# Patient Record
Sex: Male | Born: 1975
Health system: Southern US, Community
[De-identification: ages and names within clinical notes are randomized; demographics above are authoritative.]

## PROBLEM LIST (undated history)

## (undated) DIAGNOSIS — R519 Headache, unspecified: Secondary | ICD-10-CM

## (undated) DIAGNOSIS — I10 Essential (primary) hypertension: Secondary | ICD-10-CM

## (undated) DIAGNOSIS — E785 Hyperlipidemia, unspecified: Secondary | ICD-10-CM

## (undated) DIAGNOSIS — R51 Headache: Secondary | ICD-10-CM

## (undated) HISTORY — PX: TONSILLECTOMY: SUR1361

## (undated) HISTORY — PX: KNEE SURGERY: SHX244

## (undated) HISTORY — PX: WISDOM TOOTH EXTRACTION: SHX21

---

## 2010-08-19 ENCOUNTER — Encounter (INDEPENDENT_AMBULATORY_CARE_PROVIDER_SITE_OTHER): Payer: Self-pay | Admitting: *Deleted

## 2010-08-19 ENCOUNTER — Inpatient Hospital Stay (INDEPENDENT_AMBULATORY_CARE_PROVIDER_SITE_OTHER)
Admission: RE | Admit: 2010-08-19 | Discharge: 2010-08-19 | Disposition: A | Discharge status: Home / Self Care | Payer: BC Managed Care – PPO | Source: Ambulatory Visit | Attending: Emergency Medicine | Admitting: Emergency Medicine

## 2010-08-19 ENCOUNTER — Encounter: Payer: Self-pay | Admitting: Emergency Medicine

## 2010-08-19 DIAGNOSIS — R109 Unspecified abdominal pain: Secondary | ICD-10-CM

## 2010-08-19 DIAGNOSIS — M5416 Radiculopathy, lumbar region: Secondary | ICD-10-CM | POA: Insufficient documentation

## 2011-01-05 NOTE — Letter (Signed)
Summary: Out of Work  [facility]  [address]   [place], [place] [address]   Phone: [phone]  Fax: [phone]    August 19, 2010   Employee:  Andrew Bean    To Whom It May Concern:   For Medical reasons, please excuse the above named employee from work for the following dates:  08/19/2010  If you need additional information, please feel free to contact our office.         Sincerely,    Lajean Saver RN

## 2011-01-05 NOTE — Progress Notes (Signed)
Summary: Stomach Pain   Vital Signs:  Patient Profile:   35 Years Old Male CC:      abdominal Pain an diarrhea x 2am this AM Height:     74 inches Weight:      234 pounds O2 Sat:      98 % O2 treatment:    Room Air Temp:     98.5 degrees F oral Pulse rate:   83 / minute Resp:     16 per minute BP sitting:   130 / 85  (left arm) Cuff size:   large  Pt. in pain?   yes    Location:   center abdomen    Type:       sharp  Vitals Entered By: Lajean Saver RN (August 19, 2010 8:40 AM)                   Updated Prior Medication List: No Medications Current Allergies: No known allergies History of Present Illness History from: patient Chief Complaint: abdominal Pain an diarrhea x 2am this AM History of Present Illness: 1) Abd pain / cramping since 2am this morning.  He woke up and had diarrhea most of the night.  Over the past few hours, he is feeling much better.  No blood in stool.  No N/V.  He ate pizza and cereal last night.  His younger son is with him today as well and he has the same symptoms.  He is here because he has been up since early morning and unable to sleep, so his work requires a doctor's note to take the day off today.  No history of stomach problems, no GERD, no CP, SOB.  2) Asking for something to help him sleep.  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  He has tried Unisom but it makes him drowsy in the morning.  REVIEW OF SYSTEMS Constitutional Symptoms      Denies fever, chills, night sweats, weight loss, weight gain, and fatigue.  Eyes       Denies change in vision, eye pain, eye discharge, glasses, contact lenses, and eye surgery. Ear/Nose/Throat/Mouth       Denies hearing loss/aids, change in hearing, ear pain, ear discharge, dizziness, frequent runny nose, frequent nose bleeds, sinus problems, sore throat, hoarseness, and tooth pain or bleeding.  Respiratory       Denies dry cough, productive cough, wheezing,  shortness of breath, asthma, bronchitis, and emphysema/COPD.  Cardiovascular       Denies murmurs, chest pain, and tires easily with exhertion.    Gastrointestinal       Complains of stomach pain and diarrhea.      Denies nausea/vomiting, constipation, blood in bowel movements, and indigestion.      Comments: x 2am Genitourniary       Denies painful urination, kidney stones, and loss of urinary control. Neurological       Denies paralysis, seizures, and fainting/blackouts. Musculoskeletal       Denies muscle pain, joint pain, joint stiffness, decreased range of motion, redness, swelling, muscle weakness, and gout.  Skin       Denies bruising, unusual mles/lumps or sores, and hair/skin or nail changes.  Psych       Denies mood changes, temper/anger issues, anxiety/stress, speech problems, depression, and sleep problems. Other Comments: Son has same symptoms   Past History:  Past Medical History: Unremarkable  Past Surgical History: knee- Bilateral Foot- bilateral  Family  History: Thyroid CA DM HTN  Social History: Current Smoker 1/2 PPD Alcohol use-yes Drug use-no Smoking Status:  current Drug Use:  no Physical Exam General appearance: well developed, well nourished, no acute distress Chest/Lungs: no rales, wheezes, or rhonchi bilateral, breath sounds equal without effort Heart: regular rate and  rhythm, no murmur Abdomen: soft, non-tender without obvious organomegaly Skin: no obvious rashes or lesions MSE: oriented to time, place, and person Assessment New Problems: ABDOMINAL PAIN (ICD-789.00)   Plan New Medications/Changes: MELATONIN 3 MG TABS (MELATONIN) 1-2 tabs by mouth at bedtime as needed for sleep  #60 x 1, 08/19/2010, Hoyt Koch MD  New Orders: New Patient Level III [phone] Planning Comments:   1) Symptomatic care (hydration, bland diet, rest, note for work today).  If symptoms persist, we can call in Rx, either Phenergan vs Bentyl vs Lamotil,  whatever symptoms are worsening.  Clean the house to prevent others from becoming sick. 2) Rx for Melatonin   The patient and/or caregiver has been counseled thoroughly with regard to medications prescribed including dosage, schedule, interactions, rationale for use, and possible side effects and they verbalize understanding.  Diagnoses and expected course of recovery discussed and will return if not improved as expected or if the condition worsens. Patient and/or caregiver verbalized understanding.  Prescriptions: MELATONIN 3 MG TABS (MELATONIN) 1-2 tabs by mouth at bedtime as needed for sleep  #60 x 1   Entered and Authorized by:   Hoyt Koch MD   Signed by:   Hoyt Koch MD on 08/19/2010   Method used:   Print then Give to Patient   RxID:   [id number]   Orders Added: 1)  New Patient Level III [95621]

## 2012-06-14 ENCOUNTER — Encounter: Payer: Self-pay | Admitting: *Deleted

## 2012-06-14 ENCOUNTER — Emergency Department
Admission: EM | Admit: 2012-06-14 | Discharge: 2012-06-14 | Disposition: A | Discharge status: Home / Self Care | Payer: BC Managed Care – PPO | Source: Home / Self Care | Attending: Family Medicine | Admitting: Family Medicine

## 2012-06-14 DIAGNOSIS — Z716 Tobacco abuse counseling: Secondary | ICD-10-CM

## 2012-06-14 DIAGNOSIS — R42 Dizziness and giddiness: Secondary | ICD-10-CM

## 2012-06-14 DIAGNOSIS — R062 Wheezing: Secondary | ICD-10-CM

## 2012-06-14 DIAGNOSIS — J329 Chronic sinusitis, unspecified: Secondary | ICD-10-CM

## 2012-06-14 HISTORY — DX: Hyperlipidemia, unspecified: E78.5

## 2012-06-14 HISTORY — DX: Essential (primary) hypertension: I10

## 2012-06-14 MED ORDER — MECLIZINE HCL 25 MG PO TABS: 25.0000 mg | ORAL_TABLET | Freq: Three times a day (TID) | ORAL | Status: DC | PRN

## 2012-06-14 MED ORDER — AMOXICILLIN 875 MG PO TABS: 875.0000 mg | ORAL_TABLET | Freq: Two times a day (BID) | ORAL | Status: DC

## 2012-06-14 MED ORDER — METHYLPREDNISOLONE ACETATE 80 MG/ML IJ SUSP
80.0000 mg | Freq: Once | INTRAMUSCULAR | Status: AC
  Administered 2012-06-14: 80 mg via INTRAMUSCULAR

## 2012-06-14 NOTE — ED Provider Notes (Signed)
History     CSN: [id number]  Arrival date & time 06/14/12  0808   First MD Initiated Contact with Patient 06/14/12 [phone]      Chief Complaint  Patient presents with  . Dizziness  . Nasal Congestion   HPI  URI Symptoms Onset: 2-3 days  Description: sinus pressure, nasal congestion, ear pain, vertiginous sxs  Modifying factors:  2 PPD smoker. 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 No hemiparesis, confusion, slurred speech, trauma.   Symptoms Nasal discharge: yes Fever: no Sore throat: mild Cough: yes Wheezing: yes Ear pain: yes GI symptoms: no Sick contacts: no  Red Flags  Stiff neck: no Dyspnea: no Rash: no Swallowing difficulty: no  Sinusitis Risk Factors Headache/face pain: yes Double sickening: no tooth pain: no  Allergy Risk Factors Sneezing: yes Itchy scratchy throat: mild Seasonal symptoms: no  Flu Risk Factors Headache: no muscle aches: no severe fatigue: no   Past Medical History  Diagnosis Date  . Hypertension   . Hyperlipemia     Past Surgical History  Procedure Laterality Date  . Knee surgery Right     Family History  Problem Relation Age of Onset  . Heart failure Father   . Cancer Brother     thyroid    History  Substance Use Topics  . Smoking status: Current Every Day Smoker -- 2.00 packs/day  . Smokeless tobacco: Not on file  . Alcohol Use: Yes      Review of Systems  All other systems reviewed and are negative.    Allergies  Bee venom  Home Medications   Current Outpatient Rx  Name  Route  Sig  Dispense  Refill  . amoxicillin (AMOXIL) 875 MG tablet   Oral   Take 1 tablet (875 mg total) by mouth 2 (two) times daily.   20 tablet   0   . meclizine (ANTIVERT) 25 MG tablet   Oral   Take 1 tablet (25 mg total) by mouth 3 (three) times daily as needed.   30 tablet   0     BP 127/83  Pulse 95  Temp(Src) 97.5 F (36.4 C) (Oral)  Resp 18  Wt 239 lb (108.41 kg)  BMI 30.67  kg/m2  SpO2 98%  Physical Exam  Constitutional: He appears well-developed and well-nourished.  HENT:  Head: Normocephalic and atraumatic.  Right Ear: External ear normal.  Left Ear: External ear normal.  +nasal erythema, rhinorrhea bilaterally, + post oropharyngeal erythema  + maxillary TTP bilaterally    Eyes: Pupils are equal, round, and reactive to light.  Neck: Normal range of motion.  Cardiovascular: Normal rate, regular rhythm and normal heart sounds.   Pulmonary/Chest: Effort normal.  Faint wheezes in bases  Abdominal: Soft.  Musculoskeletal: Normal range of motion.  Neurological: He is alert.  dix hallpike + bilaterally      ED Course  Procedures (including critical care time)  Labs Reviewed - No data to display No results found.   1. Sinusitis   2. Vertigo   3. Tobacco abuse counseling       MDM  Depomedrol for wheezing Amox for infectious coverage.  Meclizine for vertigo.  Discussed smoking cessation at length. Discussed supportive care and infectious/neuroENT  red flags at length.  Follow up as needed.     The patient and/or caregiver has been counseled thoroughly with regard to treatment plan and/or medications prescribed including dosage, schedule, interactions, rationale for use, and possible side  effects and they verbalize understanding. Diagnoses and expected course of recovery discussed and will return if not improved as expected or if the condition worsens. Patient and/or caregiver verbalized understanding.             Doree Albee, MD 06/14/12 [phone]

## 2012-06-14 NOTE — ED Notes (Signed)
Pt c/o nasal congestion, sinus pressure and bilateral ear fullness x 2 days, with dizziness x 1 day. He has taken Claritin x 2 days with no relief. He also c/o insomnia x 1 mth. He has taken melatonin with no relief.

## 2012-09-16 ENCOUNTER — Emergency Department
Admission: EM | Admit: 2012-09-16 | Discharge: 2012-09-16 | Disposition: A | Discharge status: Home / Self Care | Payer: BC Managed Care – PPO | Source: Home / Self Care | Attending: Emergency Medicine | Admitting: Emergency Medicine

## 2012-09-16 ENCOUNTER — Encounter: Payer: Self-pay | Admitting: *Deleted

## 2012-09-16 DIAGNOSIS — T6391XA Toxic effect of contact with unspecified venomous animal, accidental (unintentional), initial encounter: Secondary | ICD-10-CM

## 2012-09-16 MED ORDER — METHYLPREDNISOLONE ACETATE 80 MG/ML IJ SUSP
80.0000 mg | Freq: Once | INTRAMUSCULAR | Status: AC
  Administered 2012-09-16: 80 mg via INTRAMUSCULAR

## 2012-09-16 NOTE — ED Provider Notes (Addendum)
CSN: [id number]     Arrival date & time 09/16/12  0932 History     First MD Initiated Contact with Patient 09/16/12 [phone]     Chief Complaint  Patient presents with  . Insect Bite    Post bee sting   The history is provided by the patient.   yesterday, standby the left side of neck and had immediate pain swelling and itch. He has a known history of severe reactions to bee stings, so he used his EpiPen within 10 minutes and that significantly helped. He never had dyspnea or dysphasia.  However, he still feels swelling and pain in the itch of the left side of neck and jaw but no actual dysphasia or lip swelling or breathing problems. Chest pain or dyspnea.  Past Medical History  Diagnosis Date  . Hypertension   . Hyperlipemia    Past Surgical History  Procedure Laterality Date  . Knee surgery Right    Family History  Problem Relation Age of Onset  . Heart failure Father   . Cancer Brother     thyroid   History  Substance Use Topics  . Smoking status: Former Smoker -- 2.00 packs/day    Quit date: 08/15/2012  . Smokeless tobacco: Current User     Comment: vapor cigarrettes  . Alcohol Use: Yes    Review of Systems  All other systems reviewed and are negative.    Allergies  Bee venom  Home Medications   Current Outpatient Rx  Name  Route  Sig  Dispense  Refill  . EPINEPHrine (EPI-PEN) 0.3 mg/0.3 mL SOAJ injection   Intramuscular   Inject into the muscle once.          BP 128/89  Pulse 80  Temp(Src) 98.1 F (36.7 C) (Oral)  Resp 18  Ht 6\' 1"  (1.854 m)  Wt 244 lb (110.678 kg)  BMI 32.2 kg/m2  SpO2 98% Physical Exam  Nursing note and vitals reviewed. Constitutional: He is oriented to person, place, and time. He appears well-developed and well-nourished.  Non-toxic appearance. He appears distressed (moderately uncomfortable but no acute cardiorespiratory distress).  HENT:  Head:    Right Ear: External ear normal.  Left Ear: External ear normal.  Nose:  Nose normal.  Mouth/Throat: Oropharynx is clear and moist.  Eyes: Conjunctivae and EOM are normal. Pupils are equal, round, and reactive to light.  Neck: Trachea normal and phonation normal. Neck supple. No JVD present.  Cardiovascular: Normal rate, regular rhythm and normal heart sounds.  Exam reveals no gallop and no friction rub.   No murmur heard. Pulmonary/Chest: Effort normal and breath sounds normal. No stridor. He has no wheezes.  Abdominal: Soft.  Musculoskeletal: Normal range of motion. He exhibits no edema and no tenderness.  Neurological: He is alert and oriented to person, place, and time.  Skin: Skin is warm and dry. There is erythema (Of the left jaw/neck area. No open wound).  Psychiatric: He has a normal mood and affect. His behavior is normal. Judgment normal.    ED Course   Procedures (including critical care time)  Labs Reviewed - No data to display No results found. 1. Bee sting reaction, initial encounter     MDM  Although he does not have anaphylaxis or urticaria, he has a strong history of severe reactions to bee stings. The EpiPen was effective yesterday. The swelling and left jaw and neck area is decreased from yesterday, but still present .  Will therefore treat aggressively with  Depo-Medrol 80 mg IM. He declined prednisone Dosepak. Advised OTC Zyrtec by mouth twice a day and or by mouth Benadryl at bedtime. I refilled the EpiPen to keep on hand with instructions and precautions. Red flags discussed. Go to ER stat if any severe or worsening symptoms. He voiced understanding and agreement  Lajean Manes, MD 09/16/12 1428  Lajean Manes, MD 09/16/12 [phone]

## 2012-09-16 NOTE — ED Notes (Signed)
Mindy reports being sting by a bee on the left side of lower jaw yesterday, he immediately used his Epi-pen. No further reaction other than swelling. Today he c/o jaw soreness and neck edema, denies tightness or difficulty breathing. He is in need of a refill of his epi-pen, he used the last one yesterday.

## 2012-11-02 ENCOUNTER — Encounter: Payer: Self-pay | Admitting: Emergency Medicine

## 2012-11-02 ENCOUNTER — Emergency Department (INDEPENDENT_AMBULATORY_CARE_PROVIDER_SITE_OTHER)
Admission: EM | Admit: 2012-11-02 | Discharge: 2012-11-02 | Disposition: A | Discharge status: Home / Self Care | Payer: Worker's Compensation | Source: Home / Self Care | Attending: Family Medicine | Admitting: Family Medicine

## 2012-11-02 DIAGNOSIS — S51812A Laceration without foreign body of left forearm, initial encounter: Secondary | ICD-10-CM

## 2012-11-02 DIAGNOSIS — S51809A Unspecified open wound of unspecified forearm, initial encounter: Secondary | ICD-10-CM

## 2012-11-02 NOTE — ED Notes (Signed)
Patient was injured on the job/ left arm earlier today; seen in [place] ER and staples were placed; returned to work and one staple fell out.

## 2012-11-02 NOTE — ED Provider Notes (Signed)
CSN: [id number]     Arrival date & time 11/02/12  1805 History   First MD Initiated Contact with Patient 11/02/12 1825     Chief Complaint  Patient presents with  . Extremity Laceration    HPI  Pt presents with L arm laceration.  This is a worker's compensation follow up.  Pt was cut by piece of sheet metal in L arm earlier in the day.  Pt was seen in ER in [place], [place].  Per pt, wound was assessed and cleansed.  Tdap given.  Area closed with staples.  One the staples came out on the way back to the area.  Mild bleeding s/p closure.  Full arm ROM.    Past Medical History  Diagnosis Date  . Hypertension   . Hyperlipemia    Past Surgical History  Procedure Laterality Date  . Knee surgery Right    Family History  Problem Relation Age of Onset  . Heart failure Father   . Cancer Brother     thyroid   History  Substance Use Topics  . Smoking status: Former Smoker -- 2.00 packs/day    Quit date: 08/15/2012  . Smokeless tobacco: Current User     Comment: vapor cigarrettes  . Alcohol Use: Yes    Review of Systems  All other systems reviewed and are negative.    Allergies  Bee venom  Home Medications   Current Outpatient Rx  Name  Route  Sig  Dispense  Refill  . EPINEPHrine (EPI-PEN) 0.3 mg/0.3 mL SOAJ injection   Intramuscular   Inject into the muscle once.          There were no vitals taken for this visit. Physical Exam  Constitutional: He appears well-developed and well-nourished.  HENT:  Head: Normocephalic and atraumatic.  Eyes: Conjunctivae are normal. Pupils are equal, round, and reactive to light.  Neck: Normal range of motion.  Cardiovascular: Normal rate and regular rhythm.   Pulmonary/Chest: Effort normal and breath sounds normal.  Abdominal: Soft.  Musculoskeletal: Normal range of motion.  Skin:  l lateral forearm laceration     ED Course  Procedures (including critical care time) Labs Review Labs Reviewed - No data to  display Imaging Review No results found.  MDM   1. Forearm laceration, left, initial encounter    3 additional staples placed at bedside.  Given that staple came loose without any significant manual manipulation, will hold pt out of work pending occupational health reassessment in am. 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 May benefit from light duty for 7-10 days pending removal of staples as to decrease risk of wound complications..  Discussed general care and wound red flags.  Follow up with occupational health in am.     The patient and/or caregiver has been counseled thoroughly with regard to treatment plan and/or medications prescribed including dosage, schedule, interactions, rationale for use, and possible side effects and they verbalize understanding. Diagnoses and expected course of recovery discussed and will return if not improved as expected or if the condition worsens. Patient and/or caregiver verbalized understanding.         Doree Albee, MD 11/02/12 [phone]

## 2014-05-09 ENCOUNTER — Other Ambulatory Visit: Payer: Self-pay | Admitting: Orthopedic Surgery

## 2014-05-17 ENCOUNTER — Encounter (HOSPITAL_COMMUNITY)
Admission: RE | Admit: 2014-05-17 | Discharge: 2014-05-17 | Disposition: A | Discharge status: Home / Self Care | Payer: Worker's Compensation | Source: Ambulatory Visit | Attending: Orthopedic Surgery | Admitting: Orthopedic Surgery

## 2014-05-17 ENCOUNTER — Encounter (HOSPITAL_COMMUNITY): Payer: Self-pay

## 2014-05-17 ENCOUNTER — Other Ambulatory Visit: Payer: Self-pay

## 2014-05-17 DIAGNOSIS — Z01818 Encounter for other preprocedural examination: Secondary | ICD-10-CM | POA: Insufficient documentation

## 2014-05-17 DIAGNOSIS — E785 Hyperlipidemia, unspecified: Secondary | ICD-10-CM | POA: Diagnosis not present

## 2014-05-17 DIAGNOSIS — F17210 Nicotine dependence, cigarettes, uncomplicated: Secondary | ICD-10-CM | POA: Diagnosis not present

## 2014-05-17 DIAGNOSIS — Z91030 Bee allergy status: Secondary | ICD-10-CM | POA: Diagnosis not present

## 2014-05-17 DIAGNOSIS — M5126 Other intervertebral disc displacement, lumbar region: Secondary | ICD-10-CM | POA: Diagnosis present

## 2014-05-17 LAB — URINALYSIS, ROUTINE W REFLEX MICROSCOPIC
BILIRUBIN URINE: NEGATIVE
Glucose, UA: NEGATIVE mg/dL
HGB URINE DIPSTICK: NEGATIVE
KETONES UR: NEGATIVE mg/dL
Leukocytes, UA: NEGATIVE
NITRITE: NEGATIVE
PROTEIN: NEGATIVE mg/dL
Specific Gravity, Urine: 1.006 (ref 1.005–1.030)
UROBILINOGEN UA: 0.2 mg/dL (ref 0.0–1.0)
pH: 5.5 (ref 5.0–8.0)

## 2014-05-17 LAB — COMPREHENSIVE METABOLIC PANEL
ALBUMIN: 4.3 g/dL (ref 3.5–5.2)
ALT: 32 U/L (ref 0–53)
AST: 24 U/L (ref 0–37)
Alkaline Phosphatase: 71 U/L (ref 39–117)
Anion gap: 11 (ref 5–15)
BILIRUBIN TOTAL: 0.7 mg/dL (ref 0.3–1.2)
BUN: 9 mg/dL (ref 6–23)
CALCIUM: 9.3 mg/dL (ref 8.4–10.5)
CHLORIDE: 105 mmol/L (ref 96–112)
CO2: 26 mmol/L (ref 19–32)
CREATININE: 0.99 mg/dL (ref 0.50–1.35)
GFR calc Af Amer: >90 mL/min (ref >90)
Glucose, Bld: 52 mg/dL — ABNORMAL LOW (ref 70–99)
Potassium: 3.6 mmol/L (ref 3.5–5.1)
SODIUM: 142 mmol/L (ref 135–145)
Total Protein: 7 g/dL (ref 6.0–8.3)

## 2014-05-17 LAB — CBC WITH DIFFERENTIAL/PLATELET
BASOS ABS: 0 10*3/uL (ref 0.0–0.1)
Basophils Relative: 1 % (ref 0–1)
EOS PCT: 4 % (ref 0–5)
Eosinophils Absolute: 0.3 10*3/uL (ref 0.0–0.7)
HCT: 50.4 % (ref 39.0–52.0)
Hemoglobin: 17.6 g/dL — ABNORMAL HIGH (ref 13.0–17.0)
LYMPHS PCT: 47 % — AB (ref 12–46)
Lymphs Abs: 4.1 10*3/uL — ABNORMAL HIGH (ref 0.7–4.0)
MCH: 32.6 pg (ref 26.0–34.0)
MCHC: 34.9 g/dL (ref 30.0–36.0)
MCV: 93.3 fL (ref 78.0–100.0)
Monocytes Absolute: 0.6 10*3/uL (ref 0.1–1.0)
Monocytes Relative: 7 % (ref 3–12)
Neutro Abs: 3.5 10*3/uL (ref 1.7–7.7)
Neutrophils Relative %: 41 % — ABNORMAL LOW (ref 43–77)
PLATELETS: 285 10*3/uL (ref 150–400)
RBC: 5.4 MIL/uL (ref 4.22–5.81)
RDW: 12.7 % (ref 11.5–15.5)
WBC: 8.5 10*3/uL (ref 4.0–10.5)

## 2014-05-17 LAB — APTT: APTT: 33 s (ref 24–37)

## 2014-05-17 LAB — SURGICAL PCR SCREEN
MRSA, PCR: NEGATIVE
Staphylococcus aureus: NEGATIVE

## 2014-05-17 LAB — PROTIME-INR
INR: 1.02 (ref 0.00–1.49)
Prothrombin Time: 13.5 seconds (ref 11.6–15.2)

## 2014-05-17 NOTE — Progress Notes (Signed)
Pt's glucose was 52. Called pt to see if he has had issues with hypoglycemia. He states he had not eaten since yesterday, only had water prior to his appt here today. States that he has never had problems before, but did feel a little nauseated but didn't think to much about it. He states he has eaten since leaving here and feels fine now. I did instruct him the night before surgery to eat a late meal or a late snack with protein in it since his surgery starts at noon. He voiced understanding.

## 2014-05-17 NOTE — Progress Notes (Signed)
Pt. Stated he had a history of htn. No longer takes medications and states it it diet/exercise controlled. Pt. Does not have a primary medical doctor.

## 2014-05-17 NOTE — Pre-Procedure Instructions (Addendum)
Andrew Bean  05/17/2014   Your procedure is scheduled on:  Thursday April 21  [facility]" at 9:00 AM.  Call this number if you have problems the morning of surgery: [phone]                Any questions call [phone] (Monday- Friday 8:00am- 4:00pm)   Remember:   Do not eat food or drink liquids after midnight.   Take these medicines the morning of surgery with A SIP OF WATER: tylenol if needed            Stop aspirin,fish oil, vitamins,and nonsteroidal anti-inflammatory drugs: advil ,ibuprofen, mobic, 7 days prior to surgery.   Do not wear jewelry, make-up or nail polish.  Do not wear lotions, powders, or perfumes. You may wear deodorant.  Do not shave 48 hours prior to surgery. Men may shave face and neck.  Do not bring valuables to the hospital.  [facility] is not responsible                  for any belongings or valuables.               Contacts, dentures or bridgework may not be worn into surgery.  Leave suitcase in the car. After surgery it may be brought to your room.  For patients admitted to the hospital, discharge time is determined by your                treatment team.               Patients discharged the day of surgery will not be allowed to drive  home.  Name and phone number of your driver:   Special Instructions: review preparing for surgery handout   Please read over the following fact sheets that you were given: Pain Booklet, Coughing and Deep Breathing, MRSA Information and Surgical Site Infection Prevention

## 2014-05-23 MED ORDER — POVIDONE-IODINE 7.5 % EX SOLN
Freq: Once | CUTANEOUS | Status: DC
  Filled 2014-05-23: qty 118

## 2014-05-23 MED ORDER — CEFAZOLIN SODIUM-DEXTROSE 2-3 GM-% IV SOLR
2.0000 g | INTRAVENOUS | Status: AC
  Administered 2014-05-24: 2 g via INTRAVENOUS
  Filled 2014-05-23: qty 50

## 2014-05-23 NOTE — H&P (Signed)
PREOPERATIVE H&P  Chief Complaint: Bilateral leg pain  HPI: Andrew Bean is a 39 y.o. male who presents with ongoing pain in the bilateral legs  MRI reveals a large L4/5 HNP, compression the right and left L5 nerves  Patient has failed multiple forms of conservative care and continues to have pain (see office notes for additional details regarding the patient's full course of treatment)  Past Medical History  Diagnosis Date  . Hyperlipemia    Past Surgical History  Procedure Laterality Date  . Knee surgery Right   . Tonsillectomy     History   Social History  . Marital Status: Married    Spouse Name: N/A  . Number of Children: N/A  . Years of Education: N/A   Social History Main Topics  . Smoking status: Current Every Day Smoker -- 1.00 packs/day for 14 years    Last Attempt to Quit: 08/15/2012  . Smokeless tobacco: Current User     Comment: vapor cigarrettes  . Alcohol Use: Yes     Comment: occasionally  . Drug Use: No  . Sexual Activity: Not on file   Other Topics Concern  . Not on file   Social History Narrative   Family History  Problem Relation Age of Onset  . Heart failure Father   . Cancer Brother     thyroid   Allergies  Allergen Reactions  . Bee Venom Anaphylaxis   Prior to Admission medications   Medication Sig Start Date End Date Taking? Authorizing Provider  acetaminophen-codeine (TYLENOL #3) 300-30 MG per tablet Take 2 tablets by mouth 3 (three) times daily as needed (pain).  05/11/14  Yes Historical Provider, MD  Cyanocobalamin (VITAMIN B-12 PO) Take 1 tablet by mouth daily.   Yes Historical Provider, MD  cyclobenzaprine (FLEXERIL) 5 MG tablet Take 5 mg by mouth at bedtime.  04/22/14  Yes Historical Provider, MD  EPINEPHrine (EPI-PEN) 0.3 mg/0.3 mL SOAJ injection Inject 0.3 mg into the muscle as needed (anaphylaxis reaction).    Yes Historical Provider, MD  Flaxseed, Linseed, (FLAX SEEDS PO) Take 1 capsule by mouth daily.   Yes Historical  Provider, MD  Menthol, Topical Analgesic, (BIOFREEZE EX) Apply 1 application topically 3 (three) times daily as needed (pain).   Yes Historical Provider, MD  Menthol, Topical Analgesic, (ICY HOT EX) Apply 1 application topically 3 (three) times daily as needed (pain).   Yes Historical Provider, MD  methocarbamol (ROBAXIN) 500 MG tablet Take 500 mg by mouth 2 (two) times daily.  04/22/14  Yes Historical Provider, MD  Multiple Vitamin (MULTIVITAMIN WITH MINERALS) TABS tablet Take 1 tablet by mouth daily.   Yes Historical Provider, MD  Omega-3 Fatty Acids (FISH OIL PO) Take 2 capsules by mouth 2 (two) times daily. With breakfast and lunch   Yes Historical Provider, MD  aspirin EC 81 MG tablet Take 81 mg by mouth daily.    Historical Provider, MD  ibuprofen (ADVIL,MOTRIN) 200 MG tablet Take 400-600 mg by mouth 2 (two) times daily as needed (pain).    Historical Provider, MD  meloxicam (MOBIC) 15 MG tablet Take 15 mg by mouth daily.  04/22/14   Historical Provider, MD     All other systems have been reviewed and were otherwise negative with the exception of those mentioned in the HPI and as above.  Physical Exam: There were no vitals filed for this visit.  General: Alert, no acute distress Cardiovascular: No pedal edema Respiratory: No cyanosis, no use of  accessory musculature Skin: No lesions in the area of chief complaint Neurologic: Sensation intact distally Psychiatric: Patient is competent for consent with normal mood and affect Lymphatic: No axillary or cervical lymphadenopathy  MUSCULOSKELETAL: + SLR bilaterally  Assessment/Plan: Bilateral leg pain Plan for Procedure(s): LUMBAR LAMINECTOMY/DECOMPRESSION L4/5   Emilee Hero, MD 05/23/2014 4:43 PM

## 2014-05-23 NOTE — Progress Notes (Signed)
Patient called with time change. To arrive at 945. Ok with patient

## 2014-05-24 ENCOUNTER — Encounter (HOSPITAL_COMMUNITY): Payer: Self-pay | Admitting: Surgery

## 2014-05-24 ENCOUNTER — Ambulatory Visit (HOSPITAL_COMMUNITY): Payer: Worker's Compensation | Admitting: Certified Registered Nurse Anesthetist

## 2014-05-24 ENCOUNTER — Encounter (HOSPITAL_COMMUNITY)
Admission: RE | Disposition: A | Discharge status: Home / Self Care | Payer: Worker's Compensation | Source: Ambulatory Visit | Attending: Orthopedic Surgery

## 2014-05-24 ENCOUNTER — Ambulatory Visit (HOSPITAL_COMMUNITY): Payer: Worker's Compensation

## 2014-05-24 ENCOUNTER — Observation Stay (HOSPITAL_COMMUNITY)
Admission: RE | Admit: 2014-05-24 | Discharge: 2014-05-25 | Disposition: A | Discharge status: Home / Self Care | Payer: Worker's Compensation | Source: Ambulatory Visit | Attending: Orthopedic Surgery | Admitting: Orthopedic Surgery

## 2014-05-24 DIAGNOSIS — F17210 Nicotine dependence, cigarettes, uncomplicated: Secondary | ICD-10-CM | POA: Diagnosis not present

## 2014-05-24 DIAGNOSIS — Z91030 Bee allergy status: Secondary | ICD-10-CM | POA: Diagnosis not present

## 2014-05-24 DIAGNOSIS — E785 Hyperlipidemia, unspecified: Secondary | ICD-10-CM | POA: Insufficient documentation

## 2014-05-24 DIAGNOSIS — M5126 Other intervertebral disc displacement, lumbar region: Principal | ICD-10-CM | POA: Insufficient documentation

## 2014-05-24 DIAGNOSIS — M48061 Spinal stenosis, lumbar region without neurogenic claudication: Secondary | ICD-10-CM | POA: Diagnosis present

## 2014-05-24 DIAGNOSIS — Z419 Encounter for procedure for purposes other than remedying health state, unspecified: Secondary | ICD-10-CM

## 2014-05-24 HISTORY — PX: LUMBAR LAMINECTOMY/DECOMPRESSION MICRODISCECTOMY: SHX5026

## 2014-05-24 LAB — GLUCOSE, CAPILLARY: Glucose-Capillary: 93 mg/dL (ref 70–99)

## 2014-05-24 SURGERY — LUMBAR LAMINECTOMY/DECOMPRESSION MICRODISCECTOMY
Anesthesia: General

## 2014-05-24 MED ORDER — ROCURONIUM BROMIDE 50 MG/5ML IV SOLN
INTRAVENOUS | Status: AC
  Filled 2014-05-24: qty 1

## 2014-05-24 MED ORDER — ONDANSETRON HCL 4 MG/2ML IJ SOLN
INTRAMUSCULAR | Status: AC
  Filled 2014-05-24: qty 2

## 2014-05-24 MED ORDER — DEXMEDETOMIDINE HCL IN NACL 200 MCG/50ML IV SOLN
INTRAVENOUS | Status: AC
  Filled 2014-05-24: qty 50

## 2014-05-24 MED ORDER — PHENOL 1.4 % MT LIQD: 1.0000 | OROMUCOSAL | Status: DC | PRN

## 2014-05-24 MED ORDER — OXYCODONE HCL 5 MG/5ML PO SOLN: 5.0000 mg | Freq: Once | ORAL | Status: AC | PRN

## 2014-05-24 MED ORDER — ACETAMINOPHEN 325 MG PO TABS: 650.0000 mg | ORAL_TABLET | ORAL | Status: DC | PRN

## 2014-05-24 MED ORDER — SODIUM CHLORIDE 0.9 % IV SOLN: 250.0000 mL | INTRAVENOUS | Status: DC

## 2014-05-24 MED ORDER — PHENYLEPHRINE HCL 10 MG/ML IJ SOLN
INTRAMUSCULAR | Status: DC | PRN
  Administered 2014-05-24: 40 ug via INTRAVENOUS
  Administered 2014-05-24: 80 ug via INTRAVENOUS

## 2014-05-24 MED ORDER — INDIGOTINDISULFONATE SODIUM 8 MG/ML IJ SOLN
INTRAMUSCULAR | Status: DC | PRN
  Administered 2014-05-24: .5 mL via INTRAVENOUS

## 2014-05-24 MED ORDER — LACTATED RINGERS IV SOLN
INTRAVENOUS | Status: DC | PRN
  Administered 2014-05-24 (×2): via INTRAVENOUS

## 2014-05-24 MED ORDER — SUCCINYLCHOLINE CHLORIDE 20 MG/ML IJ SOLN
INTRAMUSCULAR | Status: DC | PRN
  Administered 2014-05-24: 120 mg via INTRAVENOUS

## 2014-05-24 MED ORDER — ONDANSETRON HCL 4 MG/2ML IJ SOLN: 4.0000 mg | INTRAMUSCULAR | Status: DC | PRN

## 2014-05-24 MED ORDER — OXYCODONE-ACETAMINOPHEN 5-325 MG PO TABS
1.0000 | ORAL_TABLET | ORAL | Status: DC | PRN
  Administered 2014-05-24 – 2014-05-25 (×2): 2 via ORAL
  Filled 2014-05-24 (×2): qty 2

## 2014-05-24 MED ORDER — OXYCODONE HCL 5 MG PO TABS
ORAL_TABLET | ORAL | Status: AC
  Filled 2014-05-24: qty 1

## 2014-05-24 MED ORDER — PROPOFOL 10 MG/ML IV BOLUS
INTRAVENOUS | Status: AC
  Filled 2014-05-24: qty 20

## 2014-05-24 MED ORDER — DEXMEDETOMIDINE HCL 200 MCG/2ML IV SOLN
INTRAVENOUS | Status: DC | PRN
  Administered 2014-05-24: 12 ug via INTRAVENOUS
  Administered 2014-05-24: 8 ug via INTRAVENOUS

## 2014-05-24 MED ORDER — OXYCODONE-ACETAMINOPHEN 5-325 MG PO TABS
ORAL_TABLET | ORAL | Status: AC
  Filled 2014-05-24: qty 1

## 2014-05-24 MED ORDER — DEXAMETHASONE SODIUM PHOSPHATE 4 MG/ML IJ SOLN
INTRAMUSCULAR | Status: AC
  Filled 2014-05-24: qty 1

## 2014-05-24 MED ORDER — LIDOCAINE HCL (CARDIAC) 20 MG/ML IV SOLN
INTRAVENOUS | Status: AC
  Filled 2014-05-24: qty 5

## 2014-05-24 MED ORDER — ZOLPIDEM TARTRATE 5 MG PO TABS: 5.0000 mg | ORAL_TABLET | Freq: Every evening | ORAL | Status: DC | PRN

## 2014-05-24 MED ORDER — METHYLPREDNISOLONE ACETATE 40 MG/ML IJ SUSP
INTRAMUSCULAR | Status: DC | PRN
  Administered 2014-05-24: 40 mg

## 2014-05-24 MED ORDER — HYDROMORPHONE HCL 1 MG/ML IJ SOLN
INTRAMUSCULAR | Status: AC
  Filled 2014-05-24: qty 1

## 2014-05-24 MED ORDER — SODIUM CHLORIDE 0.9 % IJ SOLN: 3.0000 mL | INTRAMUSCULAR | Status: DC | PRN

## 2014-05-24 MED ORDER — MIDAZOLAM HCL 2 MG/2ML IJ SOLN
INTRAMUSCULAR | Status: AC
  Filled 2014-05-24: qty 2

## 2014-05-24 MED ORDER — NEOSTIGMINE METHYLSULFATE 10 MG/10ML IV SOLN
INTRAVENOUS | Status: DC | PRN
Start: 2014-05-24 — End: 2014-05-24
  Administered 2014-05-24: 5 mg via INTRAVENOUS

## 2014-05-24 MED ORDER — DIAZEPAM 5 MG PO TABS
ORAL_TABLET | ORAL | Status: AC
  Filled 2014-05-24: qty 1

## 2014-05-24 MED ORDER — MIDAZOLAM HCL 5 MG/5ML IJ SOLN
INTRAMUSCULAR | Status: DC | PRN
  Administered 2014-05-24 (×2): 2 mg via INTRAVENOUS

## 2014-05-24 MED ORDER — MENTHOL 3 MG MT LOZG: 1.0000 | LOZENGE | OROMUCOSAL | Status: DC | PRN

## 2014-05-24 MED ORDER — ALUM & MAG HYDROXIDE-SIMETH 200-200-20 MG/5ML PO SUSP: 30.0000 mL | Freq: Four times a day (QID) | ORAL | Status: DC | PRN

## 2014-05-24 MED ORDER — ACETAMINOPHEN 650 MG RE SUPP: 650.0000 mg | RECTAL | Status: DC | PRN

## 2014-05-24 MED ORDER — BUPIVACAINE-EPINEPHRINE (PF) 0.25% -1:200000 IJ SOLN
INTRAMUSCULAR | Status: AC
  Filled 2014-05-24: qty 30

## 2014-05-24 MED ORDER — SODIUM CHLORIDE 0.9 % IJ SOLN: 3.0000 mL | Freq: Two times a day (BID) | INTRAMUSCULAR | Status: DC

## 2014-05-24 MED ORDER — GLYCOPYRROLATE 0.2 MG/ML IJ SOLN
INTRAMUSCULAR | Status: AC
  Filled 2014-05-24: qty 4

## 2014-05-24 MED ORDER — GLYCOPYRROLATE 0.2 MG/ML IJ SOLN
INTRAMUSCULAR | Status: DC | PRN
  Administered 2014-05-24: .8 mg via INTRAVENOUS

## 2014-05-24 MED ORDER — ARTIFICIAL TEARS OP OINT
TOPICAL_OINTMENT | OPHTHALMIC | Status: AC
  Filled 2014-05-24: qty 7

## 2014-05-24 MED ORDER — DIAZEPAM 5 MG PO TABS
5.0000 mg | ORAL_TABLET | Freq: Four times a day (QID) | ORAL | Status: DC | PRN
  Administered 2014-05-25: 5 mg via ORAL
  Filled 2014-05-24: qty 1

## 2014-05-24 MED ORDER — PHENYLEPHRINE 40 MCG/ML (10ML) SYRINGE FOR IV PUSH (FOR BLOOD PRESSURE SUPPORT)
PREFILLED_SYRINGE | INTRAVENOUS | Status: AC
  Filled 2014-05-24: qty 20

## 2014-05-24 MED ORDER — SURGIFOAM 100 EX MISC
CUTANEOUS | Status: DC | PRN
  Administered 2014-05-24: 20000 mL via TOPICAL

## 2014-05-24 MED ORDER — ARTIFICIAL TEARS OP OINT
TOPICAL_OINTMENT | OPHTHALMIC | Status: DC | PRN
Start: 2014-05-24 — End: 2014-05-24
  Administered 2014-05-24: 1 via OPHTHALMIC

## 2014-05-24 MED ORDER — METHYLERGONOVINE MALEATE 0.2 MG/ML IJ SOLN
INTRAMUSCULAR | Status: AC
  Filled 2014-05-24: qty 1

## 2014-05-24 MED ORDER — ONDANSETRON HCL 4 MG/2ML IJ SOLN
INTRAMUSCULAR | Status: DC | PRN
  Administered 2014-05-24: 4 mg via INTRAVENOUS

## 2014-05-24 MED ORDER — HEMOSTATIC AGENTS (NO CHARGE) OPTIME
TOPICAL | Status: DC | PRN
  Administered 2014-05-24: 1 via TOPICAL

## 2014-05-24 MED ORDER — 0.9 % SODIUM CHLORIDE (POUR BTL) OPTIME
TOPICAL | Status: DC | PRN
  Administered 2014-05-24: 1000 mL

## 2014-05-24 MED ORDER — OXYCODONE HCL 5 MG PO TABS
5.0000 mg | ORAL_TABLET | Freq: Once | ORAL | Status: AC | PRN
  Administered 2014-05-24: 5 mg via ORAL

## 2014-05-24 MED ORDER — OXYCODONE-ACETAMINOPHEN 5-325 MG PO TABS
1.0000 | ORAL_TABLET | Freq: Once | ORAL | Status: AC
  Administered 2014-05-24: 1 via ORAL

## 2014-05-24 MED ORDER — LIDOCAINE HCL (CARDIAC) 20 MG/ML IV SOLN
INTRAVENOUS | Status: DC | PRN
  Administered 2014-05-24: 50 mg via INTRAVENOUS

## 2014-05-24 MED ORDER — ADULT MULTIVITAMIN W/MINERALS CH
1.0000 | ORAL_TABLET | Freq: Every day | ORAL | Status: DC
  Filled 2014-05-24: qty 1

## 2014-05-24 MED ORDER — PROMETHAZINE HCL 25 MG/ML IJ SOLN: 6.2500 mg | INTRAMUSCULAR | Status: DC | PRN

## 2014-05-24 MED ORDER — METHYLPREDNISOLONE ACETATE 40 MG/ML IJ SUSP
INTRAMUSCULAR | Status: AC
  Filled 2014-05-24: qty 1

## 2014-05-24 MED ORDER — FENTANYL CITRATE (PF) 250 MCG/5ML IJ SOLN
INTRAMUSCULAR | Status: AC
  Filled 2014-05-24: qty 5

## 2014-05-24 MED ORDER — METHYLENE BLUE 1 % INJ SOLN
INTRAMUSCULAR | Status: AC
  Filled 2014-05-24: qty 10

## 2014-05-24 MED ORDER — FENTANYL CITRATE (PF) 100 MCG/2ML IJ SOLN
INTRAMUSCULAR | Status: DC | PRN
  Administered 2014-05-24 (×3): 50 ug via INTRAVENOUS
  Administered 2014-05-24: 100 ug via INTRAVENOUS
  Administered 2014-05-24: 50 ug via INTRAVENOUS

## 2014-05-24 MED ORDER — SUCCINYLCHOLINE CHLORIDE 20 MG/ML IJ SOLN
INTRAMUSCULAR | Status: AC
  Filled 2014-05-24: qty 1

## 2014-05-24 MED ORDER — MORPHINE SULFATE 2 MG/ML IJ SOLN: 2.0000 mg | INTRAMUSCULAR | Status: DC | PRN

## 2014-05-24 MED ORDER — CEFAZOLIN SODIUM 1-5 GM-% IV SOLN
1.0000 g | Freq: Three times a day (TID) | INTRAVENOUS | Status: DC
  Administered 2014-05-24: 1 g via INTRAVENOUS
  Filled 2014-05-24 (×2): qty 50

## 2014-05-24 MED ORDER — ASPIRIN EC 81 MG PO TBEC
81.0000 mg | DELAYED_RELEASE_TABLET | Freq: Every day | ORAL | Status: DC
  Filled 2014-05-24: qty 1

## 2014-05-24 MED ORDER — DEXAMETHASONE SODIUM PHOSPHATE 4 MG/ML IJ SOLN
INTRAMUSCULAR | Status: DC | PRN
  Administered 2014-05-24: 4 mg via INTRAVENOUS

## 2014-05-24 MED ORDER — NEOSTIGMINE METHYLSULFATE 10 MG/10ML IV SOLN
INTRAVENOUS | Status: AC
  Filled 2014-05-24: qty 1

## 2014-05-24 MED ORDER — LACTATED RINGERS IV SOLN
INTRAVENOUS | Status: DC
  Administered 2014-05-24: 11:00:00 via INTRAVENOUS

## 2014-05-24 MED ORDER — HYDROMORPHONE HCL 1 MG/ML IJ SOLN
0.2500 mg | INTRAMUSCULAR | Status: DC | PRN
  Administered 2014-05-24 (×4): 0.5 mg via INTRAVENOUS

## 2014-05-24 MED ORDER — ROCURONIUM BROMIDE 100 MG/10ML IV SOLN
INTRAVENOUS | Status: DC | PRN
  Administered 2014-05-24 (×2): 25 mg via INTRAVENOUS
  Administered 2014-05-24: 50 mg via INTRAVENOUS

## 2014-05-24 MED ORDER — DIAZEPAM 5 MG PO TABS
5.0000 mg | ORAL_TABLET | Freq: Once | ORAL | Status: AC
  Administered 2014-05-24: 5 mg via ORAL

## 2014-05-24 MED ORDER — BUPIVACAINE-EPINEPHRINE 0.25% -1:200000 IJ SOLN
INTRAMUSCULAR | Status: DC | PRN
  Administered 2014-05-24: 5 mL

## 2014-05-24 MED ORDER — THROMBIN 5000 UNITS EX SOLR
CUTANEOUS | Status: AC
  Filled 2014-05-24: qty 20000

## 2014-05-24 MED ORDER — PROPOFOL 10 MG/ML IV BOLUS
INTRAVENOUS | Status: DC | PRN
  Administered 2014-05-24: 200 mg via INTRAVENOUS

## 2014-05-24 SURGICAL SUPPLY — 74 items
BENZOIN TINCTURE PRP APPL 2/3 (GAUZE/BANDAGES/DRESSINGS) ×2 IMPLANT
BUR ROUND PRECISION 4.0 (BURR) ×2 IMPLANT
CANISTER SUCTION 2500CC (MISCELLANEOUS) ×2 IMPLANT
CARTRIDGE OIL MAESTRO DRILL (MISCELLANEOUS) ×1 IMPLANT
CLSR STERI-STRIP ANTIMIC 1/2X4 (GAUZE/BANDAGES/DRESSINGS) ×2 IMPLANT
CORDS BIPOLAR (ELECTRODE) ×2 IMPLANT
COVER SURGICAL LIGHT HANDLE (MISCELLANEOUS) ×2 IMPLANT
DIFFUSER DRILL AIR PNEUMATIC (MISCELLANEOUS) ×2 IMPLANT
DRAIN CHANNEL 15F RND FF W/TCR (WOUND CARE) IMPLANT
DRAPE POUCH INSTRU U-SHP 10X18 (DRAPES) ×4 IMPLANT
DRAPE SURG 17X23 STRL (DRAPES) ×8 IMPLANT
DURAPREP 26ML APPLICATOR (WOUND CARE) ×2 IMPLANT
ELECT BLADE 4.0 EZ CLEAN MEGAD (MISCELLANEOUS)
ELECT CAUTERY BLADE 6.4 (BLADE) ×2 IMPLANT
ELECT REM PT RETURN 9FT ADLT (ELECTROSURGICAL) ×2
ELECTRODE BLDE 4.0 EZ CLN MEGD (MISCELLANEOUS) IMPLANT
ELECTRODE REM PT RTRN 9FT ADLT (ELECTROSURGICAL) ×1 IMPLANT
EVACUATOR SILICONE 100CC (DRAIN) IMPLANT
FILTER STRAW FLUID ASPIR (MISCELLANEOUS) ×2 IMPLANT
GAUZE SPONGE 4X4 12PLY STRL (GAUZE/BANDAGES/DRESSINGS) ×2 IMPLANT
GAUZE SPONGE 4X4 16PLY XRAY LF (GAUZE/BANDAGES/DRESSINGS) ×4 IMPLANT
GLOVE BIO SURGEON STRL SZ7 (GLOVE) ×2 IMPLANT
GLOVE BIO SURGEON STRL SZ7.5 (GLOVE) ×2 IMPLANT
GLOVE BIO SURGEON STRL SZ8 (GLOVE) ×2 IMPLANT
GLOVE BIOGEL M 6.5 STRL (GLOVE) ×2 IMPLANT
GLOVE BIOGEL M STRL SZ7.5 (GLOVE) ×2 IMPLANT
GLOVE BIOGEL PI IND STRL 7.0 (GLOVE) ×1 IMPLANT
GLOVE BIOGEL PI IND STRL 7.5 (GLOVE) ×1 IMPLANT
GLOVE BIOGEL PI IND STRL 8 (GLOVE) ×1 IMPLANT
GLOVE BIOGEL PI INDICATOR 7.0 (GLOVE) ×1
GLOVE BIOGEL PI INDICATOR 7.5 (GLOVE) ×1
GLOVE BIOGEL PI INDICATOR 8 (GLOVE) ×1
GOWN STRL REUS W/ TWL LRG LVL3 (GOWN DISPOSABLE) ×3 IMPLANT
GOWN STRL REUS W/ TWL XL LVL3 (GOWN DISPOSABLE) ×2 IMPLANT
GOWN STRL REUS W/TWL LRG LVL3 (GOWN DISPOSABLE) ×3
GOWN STRL REUS W/TWL XL LVL3 (GOWN DISPOSABLE) ×2
IV CATH 14GX2 1/4 (CATHETERS) ×2 IMPLANT
KIT BASIN OR (CUSTOM PROCEDURE TRAY) ×2 IMPLANT
KIT POSITION SURG JACKSON T1 (MISCELLANEOUS) ×2 IMPLANT
KIT ROOM TURNOVER OR (KITS) ×2 IMPLANT
NEEDLE 18GX1X1/2 (RX/OR ONLY) (NEEDLE) ×2 IMPLANT
NEEDLE 22X1 1/2 (OR ONLY) (NEEDLE) ×2 IMPLANT
NEEDLE HYPO 25GX1X1/2 BEV (NEEDLE) ×2 IMPLANT
NEEDLE SPNL 18GX3.5 QUINCKE PK (NEEDLE) ×4 IMPLANT
NS IRRIG 1000ML POUR BTL (IV SOLUTION) ×2 IMPLANT
OIL CARTRIDGE MAESTRO DRILL (MISCELLANEOUS) ×2
PACK LAMINECTOMY ORTHO (CUSTOM PROCEDURE TRAY) ×2 IMPLANT
PACK UNIVERSAL I (CUSTOM PROCEDURE TRAY) ×2 IMPLANT
PAD ARMBOARD 7.5X6 YLW CONV (MISCELLANEOUS) ×4 IMPLANT
PATTIES SURGICAL .5 X.5 (GAUZE/BANDAGES/DRESSINGS) IMPLANT
PATTIES SURGICAL .5 X1 (DISPOSABLE) ×2 IMPLANT
SPONGE GAUZE 4X4 12PLY STER LF (GAUZE/BANDAGES/DRESSINGS) ×2 IMPLANT
SPONGE INTESTINAL PEANUT (DISPOSABLE) ×2 IMPLANT
SPONGE SURGIFOAM ABS GEL 100 (HEMOSTASIS) ×2 IMPLANT
SPONGE SURGIFOAM ABS GEL SZ50 (HEMOSTASIS) ×2 IMPLANT
STRIP CLOSURE SKIN 1/2X4 (GAUZE/BANDAGES/DRESSINGS) IMPLANT
SURGIFLO TRUKIT (HEMOSTASIS) IMPLANT
SUT MNCRL AB 4-0 PS2 18 (SUTURE) ×2 IMPLANT
SUT VIC AB 0 CT1 18XCR BRD 8 (SUTURE) IMPLANT
SUT VIC AB 0 CT1 27 (SUTURE)
SUT VIC AB 0 CT1 27XBRD ANBCTR (SUTURE) IMPLANT
SUT VIC AB 0 CT1 8-18 (SUTURE)
SUT VIC AB 1 CT1 18XCR BRD 8 (SUTURE) ×1 IMPLANT
SUT VIC AB 1 CT1 8-18 (SUTURE) ×1
SUT VIC AB 2-0 CT2 18 VCP726D (SUTURE) ×2 IMPLANT
SYR 20CC LL (SYRINGE) IMPLANT
SYR BULB IRRIGATION 50ML (SYRINGE) ×2 IMPLANT
SYR CONTROL 10ML LL (SYRINGE) ×4 IMPLANT
SYR TB 1ML 26GX3/8 SAFETY (SYRINGE) ×4 IMPLANT
SYR TB 1ML LUER SLIP (SYRINGE) ×4 IMPLANT
TOWEL OR 17X24 6PK STRL BLUE (TOWEL DISPOSABLE) ×2 IMPLANT
TOWEL OR 17X26 10 PK STRL BLUE (TOWEL DISPOSABLE) ×2 IMPLANT
WATER STERILE IRR 1000ML POUR (IV SOLUTION) ×2 IMPLANT
YANKAUER SUCT BULB TIP NO VENT (SUCTIONS) ×2 IMPLANT

## 2014-05-24 NOTE — Progress Notes (Signed)
I just telephoned into patient's room to check back in with him to assess his progress. He did report to me that he felt that his left leg strength has clearly been improving. He and I were both very encouraged with this. I will be by to re-examine patient early tomorrow morning.

## 2014-05-24 NOTE — Transfer of Care (Signed)
Immediate Anesthesia Transfer of Care Note  Patient: Andrew Bean  Procedure(s) Performed: Procedure(s) with comments: LUMBAR LAMINECTOMY/DECOMPRESSION MICRODISCECTOMY (N/A) - Lumbar 4-5 decompression  Patient Location: PACU  Anesthesia Type:General  Level of Consciousness: awake and alert   Airway & Oxygen Therapy: Patient Spontanous Breathing and Patient connected to nasal cannula oxygen  Post-op Assessment: Report given to RN, Post -op Vital signs reviewed and stable and Patient moving all extremities X 4  Post vital signs: Reviewed and stable  Last Vitals:  Filed Vitals:   05/24/14 1731  BP: 135/68  Pulse: 95  Temp: 36.9 C  Resp: 14    Complications: No apparent anesthesia complications

## 2014-05-24 NOTE — Anesthesia Procedure Notes (Signed)
Procedure Name: Intubation Date/Time: 05/24/2014 2:16 PM Performed by: Sarita HaverFLOWERS, Merrel Crabbe T Pre-anesthesia Checklist: Patient identified, Timeout performed, Emergency Drugs available, Suction available and Patient being monitored Patient Re-evaluated:Patient Re-evaluated prior to inductionOxygen Delivery Method: Circle system utilized and Simple face mask Preoxygenation: Pre-oxygenation with 100% oxygen Intubation Type: Combination inhalational/ intravenous induction Ventilation: Mask ventilation without difficulty and Oral airway inserted - appropriate to patient size Laryngoscope Size: Miller and 3 Grade View: Grade II Tube type: Oral Tube size: 7.5 mm Number of attempts: 1 Airway Equipment and Method: Patient positioned with wedge pillow and Stylet Placement Confirmation: ETT inserted through vocal cords under direct vision,  positive ETCO2 and breath sounds checked- equal and bilateral Secured at: 23 cm Tube secured with: Tape Dental Injury: Teeth and Oropharynx as per pre-operative assessment

## 2014-05-24 NOTE — Progress Notes (Signed)
(  [id number]) Wife at bedside, expressing frustration with husband that he is still in recovery and it was time to go home. Advised wife that pt had been in recovery for 1.5 hours  received narcotics and muscle relaxer and he was still drowsy.  Advised we could attempt to get pt ready for discharge, recommended wife have prescriptions filled at local pharmacy and get something to eat and come back for patient. She agreed. Upon her departure, pt's father comes into PACU and told pt he needed to "get it together" so he can take  him home. Continued on that patient needed to "suck it up" because he too had old back surgery and it needed to expect it was going to hurt.  Patient at that time stated his left leg felt numb and he wasn't sure he could walk. Strength test repeated, pt remains weaker on left side. Patient attempted to stand and left foot rolled outward. Patient stated he was unable to straighten his foot . Had patient sit down again and helped him to straighten foot. Patient states now he is noticing his left thigh is numb and it extends down front of left left and into foot. Dr Yevette Edwardsumonski notified of patient's condition. Patient to be admitted for observation and Dr Yevette Edwardsumonski will evaluate patient in AM.

## 2014-05-24 NOTE — Anesthesia Preprocedure Evaluation (Addendum)
Anesthesia Evaluation  Patient identified by MRN, date of birth, ID band Patient awake    Reviewed: Allergy & Precautions, NPO status , Patient's Chart, lab work & pertinent test results  Airway Mallampati: III  TM Distance: >3 FB Neck ROM: Full    Dental  (+) Teeth Intact, Dental Advisory Given   Pulmonary Current Smoker,  breath sounds clear to auscultation        Cardiovascular negative cardio ROS  Rhythm:Regular Rate:Normal     Neuro/Psych negative neurological ROS     GI/Hepatic negative GI ROS, Neg liver ROS,   Endo/Other  negative endocrine ROS  Renal/GU negative Renal ROS     Musculoskeletal negative musculoskeletal ROS (+)   Abdominal   Peds  Hematology negative hematology ROS (+)   Anesthesia Other Findings   Reproductive/Obstetrics                            Anesthesia Physical Anesthesia Plan  ASA: II  Anesthesia Plan: General   Post-op Pain Management:    Induction: Intravenous  Airway Management Planned: Oral ETT  Additional Equipment:   Intra-op Plan:   Post-operative Plan: Extubation in OR  Informed Consent: I have reviewed the patients History and Physical, chart, labs and discussed the procedure including the risks, benefits and alternatives for the proposed anesthesia with the patient or authorized representative who has indicated his/her understanding and acceptance.     Plan Discussed with: CRNA  Anesthesia Plan Comments:        Anesthesia Quick Evaluation

## 2014-05-24 NOTE — Progress Notes (Signed)
Patient being admitted for complaints related to left leg numbness and weakness. I just called and spoke with patient. He described non-dermatomal numbness in what he describes as the entire left leg from his thigh to his foot. He also reports weakness on the left. Of note he did urinate spontaneously what he describes as a large volume of urine. He denies right or left leg pain. Weakness may be secondary to nerve retraction. Numbness may also be from retraction, however, per the patient, the numbness involves his thigh and leg and foot circumferentially, so etiology behind this is unclear, as it appears to be nondermatomal. CES is very unlikely, given the patient's ability to void a large volume of urine spontaneously. Plan for now is to observe overnight, and I will evaluate him early in the morning tomorrow.

## 2014-05-24 NOTE — OR Nursing (Signed)
Patient in OR room at 1350 as told by OR staff in Dr. Marshell Levanumonski's other room.  Dr. Yevette Edwardsumonski in at 1415.

## 2014-05-25 DIAGNOSIS — M5126 Other intervertebral disc displacement, lumbar region: Secondary | ICD-10-CM | POA: Diagnosis not present

## 2014-05-25 MED ORDER — METHYLPREDNISOLONE 4 MG PO TBPK: ORAL_TABLET | ORAL | Status: DC

## 2014-05-25 MED FILL — Thrombin For Soln 5000 Unit: CUTANEOUS | Qty: 4 | Status: AC

## 2014-05-25 NOTE — Progress Notes (Signed)
UR completed 

## 2014-05-25 NOTE — Op Note (Signed)
NAMKathalene Bean:  Fabry, Andrew Bean               ACCOUNT NO.:  [id number]  MEDICAL RECORD NO.:  [id number]  LOCATION:  3C09C                        FACILITY:  [facility]  PHYSICIAN:  Estill BambergMark Jahleel Stroschein, MD      DATE OF BIRTH:  09-23-1975  DATE OF PROCEDURE:  05/24/2014                               OPERATIVE REPORT   PREOPERATIVE DIAGNOSIS:  Large L4-5 central disk herniation compressing the right and left L5 nerves.  POSTOPERATIVE DIAGNOSIS:  Large L4-5 central disk herniation compressing the right and left L5 nerves.  PROCEDURE:  L4-5 laminectomy with bilateral partial facetectomy and removal of very large central L4-5 disc herniation.  SURGEON:  Estill BambergMark [name], MD  ASSISTANT:  Jason CoopKayla McKenzie, PA-C.  ANESTHESIA:  General endotracheal anesthesia.  COMPLICATIONS:  None.  DISPOSITION:  Stable.  ESTIMATED BLOOD LOSS:  Minimal.  INDICATIONS FOR SURGERY:  Briefly, Andrew Bean is a very pleasant 39- year-old male who was injured at work on January 18, 2014.  The patient went on to have bilateral leg pain.  An MRI did reveal a rather large L4- 5 disc herniation.  Also identified was degenerative disc disease and protrusions at L3-4 and L5-S1, however, the L4-5 level clearly was the most significant finding and did correlate to his pain.  We therefore did discuss proceeding with the surgery reflected above.  The patient did elect to proceed.  OPERATIVE DETAILS:  On May 24, 2014, the patient was brought to Surgery and general endotracheal anesthesia was administered.  The patient was placed prone on a well-padded flat Jackson bed with a spinal frame.  Antibiotics were given and a time-out procedure was performed. The back was prepped and draped and a midline incision was made overlying the L4-5 intervertebral space.  The fascia was incised in the midline and the paraspinal musculature was retracted laterally using a self-retaining retractor.  The appropriate level was confirmed.  The L4 spinous  process was removed and a central and bilateral lateral recess decompression was performed.  Then, working on the left side, I was able to medially retract the traversing L5 nerve.  Then, using a reverse angled Epstein curettes, I was able to identify a substantial central protrusion, which clearly was occupying a substantial portion of the spinal canal.  I then used a 15-blade knife to perform an annulotomy and there were multiple disc fragments identified which were removed using a pituitary rongeur.  Of note, upon additional exploration of the epidural space, there was did continue to be a substantial prominence at the level of the intervertebral disc.  I, therefore, did use a reverse angled Epstein curette to displace multiple disc fragments into the intervertebral space.  The fragments were removed using a straight and up-biting pituitary rongeurs.  Of note, a substantial portion of the posterior annulus did have to be removed in order to obtain the decompression that was accomplished.  I then explored the wound for any additional compression and there was no additional compression noted.  Bleeding was controlled using FloSeal, in addition to bipolar electrocautery.  The wound was copiously irrigated.  A 40 mg of Depo-Medrol was introduced about the epidural space.  I then closed the wound in layers using #  1 Vicryl followed by 0 Vicryl, followed by 2-0 Vicryl, followed by 3-0 Monocryl. Benzoin and Steri-Strips were applied followed by sterile dressing.  All instrument counts were correct at the termination of the procedure.  Of note, [name] was my assistant throughout surgery, and did aid in retraction, suctioning, and closure.     Estill Bamberg, MD     MD/MEDQ  D:  05/24/2014  T:  05/25/2014  Job:  [id number]

## 2014-05-25 NOTE — Progress Notes (Signed)
Patient alert and oriented, mae's well, voiding adequate amount of urine, swallowing without difficulty, no c/o pain. Patient discharged home with family. Script and discharged instructions given to patient. Patient and family stated understanding of d/c instructions given and has an appointment with MD. 

## 2014-05-25 NOTE — Progress Notes (Signed)
    Patient doing well Patient reported postop left leg numbness and weakness, which has improved overnight   Physical Exam: Filed Vitals:   05/25/14 0400  BP: 119/68  Pulse: 94  Temp: 98.3 F (36.8 C)  Resp: 20   Patient walking in hallway with walker Dressing in place 4/5 DF, PF KE on left vs 5/5 on right. Strength has improved vs yesterday's report + sensation to light tough throughout left LE, per patient, this is an improvement vs yesterday  POD #1 s/p L4/5 decompression with removal of large L4/5 HNP with improving strength  And improving sensation  - encourage ambulation - Percocet for pain, Valium for muscle spasms - likely d/c home later today - patient has weakness and sensation decrease in left LE which has improved significantly. This is likely secondary to the retraction required to access his central L4/5 HNP. Plan is to follow him closely. Strength and sensation should very likely continue to improve.

## 2014-05-25 NOTE — Anesthesia Postprocedure Evaluation (Signed)
  Anesthesia Post-op Note  Patient: Andrew Bean  Procedure(s) Performed: Procedure(s) with comments: LUMBAR LAMINECTOMY/DECOMPRESSION MICRODISCECTOMY (N/A) - Lumbar 4-5 decompression  Patient Location: PACU  Anesthesia Type:General  Level of Consciousness: awake, alert  and oriented  Airway and Oxygen Therapy: Patient Spontanous Breathing and Patient connected to nasal cannula oxygen  Post-op Pain: mild  Post-op Assessment: Post-op Vital signs reviewed, Patient's Cardiovascular Status Stable, Respiratory Function Stable, Patent Airway and Pain level controlled  Post-op Vital Signs: stable  Last Vitals:  Filed Vitals:   05/25/14 0835  BP: 145/81  Pulse: 90  Temp: 36.6 C  Resp: 90    Complications: No apparent anesthesia complications

## 2014-05-28 ENCOUNTER — Encounter (HOSPITAL_COMMUNITY): Payer: Self-pay | Admitting: Orthopedic Surgery

## 2014-06-13 NOTE — Discharge Summary (Signed)
Patient ID: Andrew Bean MRN: [id number] DOB/AGE: 39/04/1975 39 y.o.  Admit date: 05/24/2014 Discharge date: 05/25/2014  Admission Diagnoses:  Active Problems:   Spinal stenosis at L4-L5 level   Discharge Diagnoses:  Same  Past Medical History  Diagnosis Date  . Hyperlipemia     Surgeries: Procedure(s): LUMBAR LAMINECTOMY/DECOMPRESSION MICRODISCECTOMY L4-5 on 05/24/2014   Consultants:  None  Discharged Condition: Improved  Hospital Course: Andrew Bean is an 39 y.o. male who was admitted 05/24/2014 for operative treatment of radiculopathy. Patient has severe unremitting pain that affects sleep, daily activities, and work/hobbies. After pre-op clearance the patient was taken to the operating room on 05/24/2014 and underwent  Procedure(s): LUMBAR LAMINECTOMY/DECOMPRESSION MICRODISCECTOMY L4-5.    Patient was given perioperative antibiotics:  Anti-infectives    Start     Dose/Rate Route Frequency Ordered Stop   05/24/14 2230  ceFAZolin (ANCEF) IVPB 1 g/50 mL premix  Status:  Discontinued     1 g 100 mL/hr over 30 Minutes Intravenous Every 8 hours 05/24/14 2035 05/25/14 1409   05/24/14 0600  ceFAZolin (ANCEF) IVPB 2 g/50 mL premix     2 g 100 mL/hr over 30 Minutes Intravenous On call to O.R. 05/23/14 1230 05/24/14 1435       Patient was given sequential compression devices, early ambulation to prevent DVT.  Patient benefited maximally from hospital stay and there were no complications.    Recent vital signs: BP 145/81 mmHg  Pulse 90  Temp(Src) 97.9 F (36.6 C) (Oral)  Resp 90  Ht 6\' 1"  (1.854 m)  Wt 104.582 kg (230 lb 9 oz)  BMI 30.43 kg/m2  SpO2 96%  Discharge Medications:     Medication List    STOP taking these medications        acetaminophen-codeine 300-30 MG per tablet  Commonly known as:  TYLENOL #3     aspirin EC 81 MG tablet     BIOFREEZE EX     cyclobenzaprine 5 MG tablet  Commonly known as:  FLEXERIL     FISH OIL PO     FLAX SEEDS PO       ibuprofen 200 MG tablet  Commonly known as:  ADVIL,MOTRIN     ICY HOT EX     meloxicam 15 MG tablet  Commonly known as:  MOBIC     methocarbamol 500 MG tablet  Commonly known as:  ROBAXIN      TAKE these medications        EPINEPHrine 0.3 mg/0.3 mL Soaj injection  Commonly known as:  EPI-PEN  Inject 0.3 mg into the muscle as needed (anaphylaxis reaction).     methylPREDNISolone 4 MG Tbpk tablet  Commonly known as:  MEDROL DOSEPAK  Take as directed     multivitamin with minerals Tabs tablet  Take 1 tablet by mouth daily.     VITAMIN B-12 PO  Take 1 tablet by mouth daily.        Diagnostic Studies: Dg Chest 2 View  05/17/2014   CLINICAL DATA:  Preoperative lumbar laminectomy  EXAM: CHEST  2 VIEW  COMPARISON:  None.  FINDINGS: Lungs are clear. Heart size and pulmonary vascularity are normal. No adenopathy. There is mild upper thoracic levoscoliosis.  IMPRESSION: No edema or consolidation.   Electronically Signed   By: Bretta BangWilliam  Woodruff III M.D.   On: 05/17/2014 15:36   Dg Lumbar Spine 2-3 Views  05/24/2014   CLINICAL DATA:  Intraoperative localization for L4-5 decompression  EXAM: LUMBAR SPINE -  2-3 VIEW  COMPARISON:  02/14/2014  FINDINGS: Five lumbar type vertebral bodies are noted. Needles are noted in the posterior soft tissues at L3-4 and L5-S1. The subsequent film shows surgical retractor at the L4-5 interspace with surgical instruments in place.  IMPRESSION: Intraoperative lumbar localization.   Electronically Signed   By: Alcide CleverMark  Lukens M.D.   On: 05/24/2014 15:50    Disposition: 01-Home or Self Care   POD #1 s/p L4/5 decompression with removal of large L4/5 HNP with improving strength And improving sensation  - encourage ambulation - Percocet for pain, Valium for muscle spasms - patient has weakness and sensation decrease in left LE which has improved significantly. This is likely secondary to the retraction required to access his central L4/5 HNP. Plan is to  follow him closely. Strength and sensation should very likely continue to improve. -Written scripts for pain signed and in chart -D/C instructions sheet printed and in chart -D/C today  -F/U in office 2 weeks   Signed: Georga BoraMCKENZIE, [name] 06/13/2014, 1:10 PM

## 2014-09-10 ENCOUNTER — Ambulatory Visit: Payer: Self-pay | Admitting: Family Medicine

## 2014-12-06 ENCOUNTER — Other Ambulatory Visit: Payer: Self-pay | Admitting: Orthopedic Surgery

## 2015-01-02 ENCOUNTER — Other Ambulatory Visit (HOSPITAL_COMMUNITY): Payer: Self-pay | Admitting: *Deleted

## 2015-01-02 ENCOUNTER — Encounter (HOSPITAL_COMMUNITY): Payer: Self-pay

## 2015-01-02 ENCOUNTER — Encounter (HOSPITAL_COMMUNITY)
Admission: RE | Admit: 2015-01-02 | Discharge: 2015-01-02 | Disposition: A | Discharge status: Home / Self Care | Payer: Worker's Compensation | Source: Ambulatory Visit | Attending: Orthopedic Surgery | Admitting: Orthopedic Surgery

## 2015-01-02 DIAGNOSIS — M5136 Other intervertebral disc degeneration, lumbar region: Secondary | ICD-10-CM | POA: Insufficient documentation

## 2015-01-02 DIAGNOSIS — Z0183 Encounter for blood typing: Secondary | ICD-10-CM | POA: Insufficient documentation

## 2015-01-02 DIAGNOSIS — Z01812 Encounter for preprocedural laboratory examination: Secondary | ICD-10-CM | POA: Diagnosis not present

## 2015-01-02 HISTORY — DX: Headache, unspecified: R51.9

## 2015-01-02 HISTORY — DX: Headache: R51

## 2015-01-02 LAB — SURGICAL PCR SCREEN
MRSA, PCR: NEGATIVE
Staphylococcus aureus: NEGATIVE

## 2015-01-02 LAB — CBC WITH DIFFERENTIAL/PLATELET
BASOS ABS: 0 10*3/uL (ref 0.0–0.1)
Basophils Relative: 0 %
EOS ABS: 0.3 10*3/uL (ref 0.0–0.7)
EOS PCT: 3 %
HCT: 49.8 % (ref 39.0–52.0)
Hemoglobin: 17.4 g/dL — ABNORMAL HIGH (ref 13.0–17.0)
Lymphocytes Relative: 36 %
Lymphs Abs: 3.6 10*3/uL (ref 0.7–4.0)
MCH: 32.2 pg (ref 26.0–34.0)
MCHC: 34.9 g/dL (ref 30.0–36.0)
MCV: 92.1 fL (ref 78.0–100.0)
Monocytes Absolute: 0.8 10*3/uL (ref 0.1–1.0)
Monocytes Relative: 7 %
Neutro Abs: 5.4 10*3/uL (ref 1.7–7.7)
Neutrophils Relative %: 54 %
PLATELETS: 287 10*3/uL (ref 150–400)
RBC: 5.41 MIL/uL (ref 4.22–5.81)
RDW: 12.9 % (ref 11.5–15.5)
WBC: 10.1 10*3/uL (ref 4.0–10.5)

## 2015-01-02 LAB — COMPREHENSIVE METABOLIC PANEL
ALT: 32 U/L (ref 17–63)
AST: 22 U/L (ref 15–41)
Albumin: 4 g/dL (ref 3.5–5.0)
Alkaline Phosphatase: 81 U/L (ref 38–126)
Anion gap: 9 (ref 5–15)
BUN: 10 mg/dL (ref 6–20)
CHLORIDE: 105 mmol/L (ref 101–111)
CO2: 25 mmol/L (ref 22–32)
CREATININE: 0.85 mg/dL (ref 0.61–1.24)
Calcium: 9 mg/dL (ref 8.9–10.3)
GFR calc Af Amer: >60 mL/min (ref >60)
GFR calc non Af Amer: >60 mL/min (ref >60)
Glucose, Bld: 106 mg/dL — ABNORMAL HIGH (ref 65–99)
Potassium: 3.8 mmol/L (ref 3.5–5.1)
SODIUM: 139 mmol/L (ref 135–145)
Total Bilirubin: 0.5 mg/dL (ref 0.3–1.2)
Total Protein: 6.7 g/dL (ref 6.5–8.1)

## 2015-01-02 LAB — URINALYSIS, ROUTINE W REFLEX MICROSCOPIC
Bilirubin Urine: NEGATIVE
GLUCOSE, UA: NEGATIVE mg/dL
Hgb urine dipstick: NEGATIVE
Ketones, ur: NEGATIVE mg/dL
LEUKOCYTES UA: NEGATIVE
Nitrite: NEGATIVE
PROTEIN: NEGATIVE mg/dL
Specific Gravity, Urine: 1.01 (ref 1.005–1.030)
pH: 5 (ref 5.0–8.0)

## 2015-01-02 LAB — TYPE AND SCREEN
ABO/RH(D): A POS
Antibody Screen: NEGATIVE

## 2015-01-02 LAB — APTT: APTT: 32 s (ref 24–37)

## 2015-01-02 LAB — ABO/RH: ABO/RH(D): A POS

## 2015-01-02 LAB — PROTIME-INR
INR: 0.97 (ref 0.00–1.49)
Prothrombin Time: 13.1 seconds (ref 11.6–15.2)

## 2015-01-02 NOTE — Progress Notes (Signed)
Pt was instructed to stop Dayquil and Nyquil as of today (due to Pseudoephedrine). Pt voiced understanding.

## 2015-01-02 NOTE — Progress Notes (Signed)
Pt denies cardiac history, chest pain or sob. States he has been treated for HTN in the past, but now controls it by diet.

## 2015-01-02 NOTE — Pre-Procedure Instructions (Signed)
Andrew Bean  01/02/2015      Your procedure is scheduled on Thursday, January 10, 2015 at 7:30 AM.   [facility] at 5:30 AM.   Call this number if you have problems the morning of surgery: [phone]   Any questions prior to day of surgery, please call [phone] between 8 & 4 PM.   Remember:  Do not eat food or drink liquids after midnight Wednesday, 01/09/15.  Take these medicines the morning of surgery with A SIP OF WATER: Pregabalin (Lyrica), Hydrocodone - if needed  Stop Multivitamins as 7 days prior to surgery.   Do not wear jewelry.  Do not wear lotions, powders, or cologne.  You may wear deodorant.  Men may shave face and neck.  Do not bring valuables to the hospital.  [facility] is not responsible for any belongings or valuables.  Contacts, dentures or bridgework may not be worn into surgery.  Leave your suitcase in the car.  After surgery it may be brought to your room.  For patients admitted to the hospital, discharge time will be determined by your treatment team.  Special instructions:  [place] - Preparing for Surgery  Before surgery, you can play an important role.  Because skin is not sterile, your skin needs to be as free of germs as possible.  You can reduce the number of germs on you skin by washing with CHG (chlorahexidine gluconate) soap before surgery.  CHG is an antiseptic cleaner which kills germs and bonds with the skin to continue killing germs even after washing.  Please DO NOT use if you have an allergy to CHG or antibacterial soaps.  If your skin becomes reddened/irritated stop using the CHG and inform your nurse when you arrive at Short Stay.  Do not shave (including legs and underarms) for at least 48 hours prior to the first CHG shower.  You may shave your face.  Please follow these instructions carefully:   1.  Shower with CHG Soap the night before surgery and the                                 morning of Surgery.  2.  If you choose to wash your hair, wash your hair first as usual with your       normal shampoo.  3.  After you shampoo, rinse your hair and body thoroughly to remove the                      Shampoo.  4.  Use CHG as you would any other liquid soap.  You can apply chg directly       to the skin and wash gently with scrungie or a clean washcloth.  5.  Apply the CHG Soap to your body ONLY FROM THE NECK DOWN.        Do not use on open wounds or open sores.  Avoid contact with your eyes, ears, mouth and genitals (private parts).  Wash genitals (private parts) with your normal soap.  6.  Wash thoroughly, paying special attention to the area where your surgery        will be performed.  7.  Thoroughly rinse your body with warm water from the neck down.  8.  DO NOT shower/wash with your normal soap after using and rinsing off  the CHG Soap.  9.  Pat yourself dry with a clean towel.            10.  Wear clean pajamas.            11.  Place clean sheets on your bed the night of your first shower and do not        sleep with pets.  Day of Surgery  Do not apply any lotions the morning of surgery.  Please wear clean clothes to the hospital.   Please read over the following fact sheets that you were given. Pain Booklet, Coughing and Deep Breathing, Blood Transfusion Information, MRSA Information and Surgical Site Infection Prevention
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
## 2015-01-09 MED ORDER — POVIDONE-IODINE 7.5 % EX SOLN
Freq: Once | CUTANEOUS | Status: DC
  Filled 2015-01-09: qty 118

## 2015-01-09 MED ORDER — CEFAZOLIN SODIUM-DEXTROSE 2-3 GM-% IV SOLR
2.0000 g | INTRAVENOUS | Status: AC
  Administered 2015-01-10 (×2): 2 g via INTRAVENOUS
  Filled 2015-01-09: qty 50

## 2015-01-10 ENCOUNTER — Encounter (HOSPITAL_COMMUNITY): Payer: Self-pay | Admitting: *Deleted

## 2015-01-10 ENCOUNTER — Inpatient Hospital Stay (HOSPITAL_COMMUNITY): Payer: Worker's Compensation

## 2015-01-10 ENCOUNTER — Inpatient Hospital Stay (HOSPITAL_COMMUNITY): Payer: Worker's Compensation | Admitting: Certified Registered Nurse Anesthetist

## 2015-01-10 ENCOUNTER — Inpatient Hospital Stay (HOSPITAL_COMMUNITY)
Admission: RE | Admit: 2015-01-10 | Discharge: 2015-01-11 | DRG: 455 | Disposition: A | Discharge status: Home / Self Care | Payer: Worker's Compensation | Source: Ambulatory Visit | Attending: Orthopedic Surgery | Admitting: Orthopedic Surgery

## 2015-01-10 ENCOUNTER — Encounter (HOSPITAL_COMMUNITY)
Admission: RE | Disposition: A | Discharge status: Home / Self Care | Payer: Self-pay | Source: Ambulatory Visit | Attending: Orthopedic Surgery

## 2015-01-10 DIAGNOSIS — F17210 Nicotine dependence, cigarettes, uncomplicated: Secondary | ICD-10-CM | POA: Diagnosis present

## 2015-01-10 DIAGNOSIS — E785 Hyperlipidemia, unspecified: Secondary | ICD-10-CM | POA: Diagnosis present

## 2015-01-10 DIAGNOSIS — Z6830 Body mass index (BMI) 30.0-30.9, adult: Secondary | ICD-10-CM

## 2015-01-10 DIAGNOSIS — Z419 Encounter for procedure for purposes other than remedying health state, unspecified: Secondary | ICD-10-CM

## 2015-01-10 DIAGNOSIS — M545 Low back pain: Secondary | ICD-10-CM | POA: Diagnosis present

## 2015-01-10 DIAGNOSIS — Z981 Arthrodesis status: Secondary | ICD-10-CM

## 2015-01-10 DIAGNOSIS — Z91030 Bee allergy status: Secondary | ICD-10-CM | POA: Diagnosis not present

## 2015-01-10 DIAGNOSIS — M5136 Other intervertebral disc degeneration, lumbar region: Secondary | ICD-10-CM | POA: Diagnosis not present

## 2015-01-10 DIAGNOSIS — Z79899 Other long term (current) drug therapy: Secondary | ICD-10-CM

## 2015-01-10 DIAGNOSIS — I10 Essential (primary) hypertension: Secondary | ICD-10-CM | POA: Diagnosis present

## 2015-01-10 DIAGNOSIS — M51369 Other intervertebral disc degeneration, lumbar region without mention of lumbar back pain or lower extremity pain: Secondary | ICD-10-CM | POA: Diagnosis present

## 2015-01-10 HISTORY — PX: ANTERIOR LAT LUMBAR FUSION: SHX1168

## 2015-01-10 SURGERY — ANTERIOR LATERAL LUMBAR FUSION 2 LEVELS
Anesthesia: General | Laterality: Right

## 2015-01-10 MED ORDER — BISACODYL 5 MG PO TBEC: 5.0000 mg | DELAYED_RELEASE_TABLET | Freq: Every day | ORAL | Status: DC | PRN

## 2015-01-10 MED ORDER — LIDOCAINE HCL (CARDIAC) 20 MG/ML IV SOLN
INTRAVENOUS | Status: DC | PRN
  Administered 2015-01-10: 100 mg via INTRAVENOUS

## 2015-01-10 MED ORDER — HYDROMORPHONE HCL 1 MG/ML IJ SOLN
INTRAMUSCULAR | Status: AC
  Filled 2015-01-10: qty 1

## 2015-01-10 MED ORDER — ONDANSETRON HCL 4 MG/2ML IJ SOLN
INTRAMUSCULAR | Status: AC
  Filled 2015-01-10: qty 2

## 2015-01-10 MED ORDER — PHENYLEPHRINE HCL 10 MG/ML IJ SOLN
INTRAMUSCULAR | Status: DC | PRN
  Administered 2015-01-10 (×2): 80 ug via INTRAVENOUS

## 2015-01-10 MED ORDER — ACETAMINOPHEN 325 MG PO TABS: 650.0000 mg | ORAL_TABLET | ORAL | Status: DC | PRN

## 2015-01-10 MED ORDER — DOCUSATE SODIUM 100 MG PO CAPS
100.0000 mg | ORAL_CAPSULE | Freq: Two times a day (BID) | ORAL | Status: DC
  Administered 2015-01-10: 100 mg via ORAL
  Filled 2015-01-10: qty 1

## 2015-01-10 MED ORDER — OXYCODONE-ACETAMINOPHEN 5-325 MG PO TABS
1.0000 | ORAL_TABLET | ORAL | Status: DC | PRN
  Administered 2015-01-10: 2 via ORAL
  Administered 2015-01-10: 1 via ORAL
  Administered 2015-01-11 (×2): 2 via ORAL
  Filled 2015-01-10 (×4): qty 2

## 2015-01-10 MED ORDER — OXYCODONE HCL 5 MG/5ML PO SOLN: 5.0000 mg | Freq: Once | ORAL | Status: AC | PRN

## 2015-01-10 MED ORDER — FENTANYL CITRATE (PF) 250 MCG/5ML IJ SOLN
INTRAMUSCULAR | Status: AC
  Filled 2015-01-10: qty 5

## 2015-01-10 MED ORDER — MORPHINE SULFATE (PF) 2 MG/ML IV SOLN
1.0000 mg | INTRAVENOUS | Status: DC | PRN
  Administered 2015-01-10 (×2): 4 mg via INTRAVENOUS
  Filled 2015-01-10 (×2): qty 2

## 2015-01-10 MED ORDER — FLEET ENEMA 7-19 GM/118ML RE ENEM: 1.0000 | ENEMA | Freq: Once | RECTAL | Status: DC | PRN

## 2015-01-10 MED ORDER — EPHEDRINE SULFATE 50 MG/ML IJ SOLN
INTRAMUSCULAR | Status: AC
  Filled 2015-01-10: qty 1

## 2015-01-10 MED ORDER — CEFAZOLIN SODIUM 1-5 GM-% IV SOLN
1.0000 g | Freq: Three times a day (TID) | INTRAVENOUS | Status: AC
  Administered 2015-01-10 – 2015-01-11 (×2): 1 g via INTRAVENOUS
  Filled 2015-01-10 (×2): qty 50

## 2015-01-10 MED ORDER — LIDOCAINE HCL (CARDIAC) 20 MG/ML IV SOLN
INTRAVENOUS | Status: AC
  Filled 2015-01-10: qty 5

## 2015-01-10 MED ORDER — HYDROCODONE-ACETAMINOPHEN 5-325 MG PO TABS
1.0000 | ORAL_TABLET | ORAL | Status: DC | PRN
Start: 2015-01-10 — End: 2015-01-11

## 2015-01-10 MED ORDER — PROMETHAZINE HCL 25 MG/ML IJ SOLN: 12.5000 mg | INTRAMUSCULAR | Status: DC | PRN

## 2015-01-10 MED ORDER — PHENOL 1.4 % MT LIQD: 1.0000 | OROMUCOSAL | Status: DC | PRN

## 2015-01-10 MED ORDER — ROCURONIUM BROMIDE 50 MG/5ML IV SOLN
INTRAVENOUS | Status: AC
  Filled 2015-01-10: qty 1

## 2015-01-10 MED ORDER — THROMBIN 20000 UNITS EX KIT
PACK | CUTANEOUS | Status: AC
  Filled 2015-01-10: qty 1

## 2015-01-10 MED ORDER — BUPIVACAINE-EPINEPHRINE (PF) 0.25% -1:200000 IJ SOLN
INTRAMUSCULAR | Status: AC
  Filled 2015-01-10: qty 30

## 2015-01-10 MED ORDER — DIAZEPAM 5 MG PO TABS
ORAL_TABLET | ORAL | Status: AC
  Filled 2015-01-10: qty 1

## 2015-01-10 MED ORDER — ACETAMINOPHEN 325 MG PO TABS: 325.0000 mg | ORAL_TABLET | ORAL | Status: DC | PRN

## 2015-01-10 MED ORDER — PROPOFOL 10 MG/ML IV BOLUS
INTRAVENOUS | Status: DC | PRN
  Administered 2015-01-10: 200 mg via INTRAVENOUS

## 2015-01-10 MED ORDER — SENNOSIDES-DOCUSATE SODIUM 8.6-50 MG PO TABS: 1.0000 | ORAL_TABLET | Freq: Every evening | ORAL | Status: DC | PRN

## 2015-01-10 MED ORDER — SODIUM CHLORIDE 0.9 % IJ SOLN
3.0000 mL | Freq: Two times a day (BID) | INTRAMUSCULAR | Status: DC
  Administered 2015-01-10: 3 mL via INTRAVENOUS

## 2015-01-10 MED ORDER — ALUM & MAG HYDROXIDE-SIMETH 200-200-20 MG/5ML PO SUSP: 30.0000 mL | Freq: Four times a day (QID) | ORAL | Status: DC | PRN

## 2015-01-10 MED ORDER — SUCCINYLCHOLINE CHLORIDE 20 MG/ML IJ SOLN
INTRAMUSCULAR | Status: AC
  Filled 2015-01-10: qty 1

## 2015-01-10 MED ORDER — SUCCINYLCHOLINE CHLORIDE 20 MG/ML IJ SOLN
INTRAMUSCULAR | Status: DC | PRN
  Administered 2015-01-10: 80 mg via INTRAVENOUS

## 2015-01-10 MED ORDER — SODIUM CHLORIDE 0.9 % IV SOLN
500.0000 mg | INTRAVENOUS | Status: DC | PRN
  Administered 2015-01-10: 10 ug/kg/min via INTRAVENOUS

## 2015-01-10 MED ORDER — PHENYLEPHRINE HCL 10 MG/ML IJ SOLN
10.0000 mg | INTRAMUSCULAR | Status: DC | PRN
  Administered 2015-01-10: 20 ug/min via INTRAVENOUS

## 2015-01-10 MED ORDER — MENTHOL 3 MG MT LOZG: 1.0000 | LOZENGE | OROMUCOSAL | Status: DC | PRN

## 2015-01-10 MED ORDER — PHENYLEPHRINE 40 MCG/ML (10ML) SYRINGE FOR IV PUSH (FOR BLOOD PRESSURE SUPPORT)
PREFILLED_SYRINGE | INTRAVENOUS | Status: AC
  Filled 2015-01-10: qty 10

## 2015-01-10 MED ORDER — FENTANYL CITRATE (PF) 100 MCG/2ML IJ SOLN
INTRAMUSCULAR | Status: DC | PRN
  Administered 2015-01-10: 200 ug via INTRAVENOUS
  Administered 2015-01-10: 50 ug via INTRAVENOUS

## 2015-01-10 MED ORDER — DIAZEPAM 5 MG PO TABS
5.0000 mg | ORAL_TABLET | Freq: Four times a day (QID) | ORAL | Status: DC | PRN
  Administered 2015-01-10 – 2015-01-11 (×4): 5 mg via ORAL
  Filled 2015-01-10 (×3): qty 1

## 2015-01-10 MED ORDER — NICOTINE 21 MG/24HR TD PT24
21.0000 mg | MEDICATED_PATCH | TRANSDERMAL | Status: DC
  Administered 2015-01-10: 21 mg via TRANSDERMAL
  Filled 2015-01-10: qty 1

## 2015-01-10 MED ORDER — NICOTINE 21 MG/24HR TD PT24: 21.0000 mg | MEDICATED_PATCH | Freq: Every day | TRANSDERMAL | Status: DC

## 2015-01-10 MED ORDER — SODIUM CHLORIDE 0.9 % IV SOLN: INTRAVENOUS | Status: DC

## 2015-01-10 MED ORDER — PROMETHAZINE HCL 25 MG/ML IJ SOLN
INTRAMUSCULAR | Status: AC
  Filled 2015-01-10: qty 1

## 2015-01-10 MED ORDER — ONDANSETRON HCL 4 MG/2ML IJ SOLN
INTRAMUSCULAR | Status: DC | PRN
  Administered 2015-01-10: 4 mg via INTRAVENOUS

## 2015-01-10 MED ORDER — HYDROMORPHONE HCL 1 MG/ML IJ SOLN
0.5000 mg | INTRAMUSCULAR | Status: AC | PRN
  Administered 2015-01-10 (×2): 0.5 mg via INTRAVENOUS

## 2015-01-10 MED ORDER — OXYCODONE HCL 5 MG PO TABS
ORAL_TABLET | ORAL | Status: AC
  Filled 2015-01-10: qty 1

## 2015-01-10 MED ORDER — BUPIVACAINE-EPINEPHRINE 0.25% -1:200000 IJ SOLN
INTRAMUSCULAR | Status: DC | PRN
  Administered 2015-01-10: 10 mL

## 2015-01-10 MED ORDER — METHYLENE BLUE 1 % INJ SOLN
INTRAMUSCULAR | Status: AC
  Filled 2015-01-10: qty 10

## 2015-01-10 MED ORDER — ONDANSETRON HCL 4 MG/2ML IJ SOLN: 4.0000 mg | INTRAMUSCULAR | Status: DC | PRN

## 2015-01-10 MED ORDER — 0.9 % SODIUM CHLORIDE (POUR BTL) OPTIME
TOPICAL | Status: DC | PRN
  Administered 2015-01-10: 1000 mL

## 2015-01-10 MED ORDER — SODIUM CHLORIDE 0.9 % IJ SOLN
INTRAMUSCULAR | Status: AC
  Filled 2015-01-10: qty 10

## 2015-01-10 MED ORDER — LACTATED RINGERS IV SOLN
INTRAVENOUS | Status: DC | PRN
Start: 2015-01-10 — End: 2015-01-10
  Administered 2015-01-10: 08:00:00 via INTRAVENOUS

## 2015-01-10 MED ORDER — THROMBIN 20000 UNITS EX KIT
PACK | CUTANEOUS | Status: DC | PRN
  Administered 2015-01-10: 20000 [IU] via TOPICAL

## 2015-01-10 MED ORDER — MIDAZOLAM HCL 5 MG/5ML IJ SOLN
INTRAMUSCULAR | Status: DC | PRN
  Administered 2015-01-10: 2 mg via INTRAVENOUS

## 2015-01-10 MED ORDER — ACETAMINOPHEN 160 MG/5ML PO SOLN
325.0000 mg | ORAL | Status: DC | PRN
  Filled 2015-01-10: qty 20.3

## 2015-01-10 MED ORDER — KETAMINE HCL 100 MG/ML IJ SOLN
INTRAMUSCULAR | Status: AC
  Filled 2015-01-10: qty 1

## 2015-01-10 MED ORDER — PROPOFOL 10 MG/ML IV BOLUS
INTRAVENOUS | Status: AC
  Filled 2015-01-10: qty 20

## 2015-01-10 MED ORDER — ZOLPIDEM TARTRATE 5 MG PO TABS: 5.0000 mg | ORAL_TABLET | Freq: Every evening | ORAL | Status: DC | PRN

## 2015-01-10 MED ORDER — SODIUM CHLORIDE 0.9 % IV SOLN: 250.0000 mL | INTRAVENOUS | Status: DC

## 2015-01-10 MED ORDER — MIDAZOLAM HCL 2 MG/2ML IJ SOLN
INTRAMUSCULAR | Status: AC
  Filled 2015-01-10: qty 2

## 2015-01-10 MED ORDER — SUFENTANIL CITRATE 50 MCG/ML IV SOLN
INTRAVENOUS | Status: DC
  Filled 2015-01-10: qty 100

## 2015-01-10 MED ORDER — OXYCODONE HCL 5 MG PO TABS
5.0000 mg | ORAL_TABLET | Freq: Once | ORAL | Status: AC | PRN
  Administered 2015-01-10: 5 mg via ORAL

## 2015-01-10 MED ORDER — HYDROMORPHONE HCL 1 MG/ML IJ SOLN
0.2500 mg | INTRAMUSCULAR | Status: DC | PRN
  Administered 2015-01-10 (×4): 0.5 mg via INTRAVENOUS

## 2015-01-10 MED ORDER — ACETAMINOPHEN 650 MG RE SUPP: 650.0000 mg | RECTAL | Status: DC | PRN

## 2015-01-10 MED ORDER — SUFENTANIL CITRATE 50 MCG/ML IV SOLN
INTRAVENOUS | Status: AC
  Filled 2015-01-10: qty 2

## 2015-01-10 MED ORDER — EPHEDRINE SULFATE 50 MG/ML IJ SOLN
INTRAMUSCULAR | Status: DC | PRN
  Administered 2015-01-10 (×2): 20 mg via INTRAVENOUS

## 2015-01-10 MED ORDER — LACTATED RINGERS IV SOLN
INTRAVENOUS | Status: DC | PRN
  Administered 2015-01-10 (×3): via INTRAVENOUS

## 2015-01-10 MED ORDER — SUFENTANIL CITRATE 50 MCG/ML IV SOLN
50.0000 ug | INTRAVENOUS | Status: DC | PRN
  Administered 2015-01-10: .3 ug/kg/h via INTRAVENOUS

## 2015-01-10 MED ORDER — SODIUM CHLORIDE 0.9 % IJ SOLN: 3.0000 mL | INTRAMUSCULAR | Status: DC | PRN

## 2015-01-10 SURGICAL SUPPLY — 104 items
BENZOIN TINCTURE PRP APPL 2/3 (GAUZE/BANDAGES/DRESSINGS) IMPLANT
BLADE SURG 10 STRL SS (BLADE) ×3 IMPLANT
BLADE SURG ROTATE 9660 (MISCELLANEOUS) IMPLANT
BUR PRESCISION 1.7 ELITE (BURR) IMPLANT
BUR ROUND PRECISION 4.0 (BURR) ×3 IMPLANT
BUR SABER RD CUTTING 3.0 (BURR) ×3 IMPLANT
CAGE COROENT XL 12X18X50 (Cage) ×3 IMPLANT
CAGE COROENT XL 14X18X50-10 (Cage) ×3 IMPLANT
CARTRIDGE OIL MAESTRO DRILL (MISCELLANEOUS) ×2 IMPLANT
CONT SPEC STER OR (MISCELLANEOUS) ×3 IMPLANT
CORDS BIPOLAR (ELECTRODE) ×3 IMPLANT
COVER BACK TABLE 80X110 HD (DRAPES) ×3 IMPLANT
COVER SURGICAL LIGHT HANDLE (MISCELLANEOUS) ×3 IMPLANT
DIFFUSER DRILL AIR PNEUMATIC (MISCELLANEOUS) ×3 IMPLANT
DRAIN CHANNEL 15F RND FF W/TCR (WOUND CARE) ×3 IMPLANT
DRAPE C-ARM 42X72 X-RAY (DRAPES) ×3 IMPLANT
DRAPE C-ARMOR (DRAPES) ×3 IMPLANT
DRAPE INCISE IOBAN 66X45 STRL (DRAPES) ×3 IMPLANT
DRAPE ORTHO SPLIT 77X108 STRL (DRAPES) ×1
DRAPE POUCH INSTRU U-SHP 10X18 (DRAPES) ×3 IMPLANT
DRAPE SURG 17X11 SM STRL (DRAPES) ×12 IMPLANT
DRAPE SURG 17X23 STRL (DRAPES) ×12 IMPLANT
DRAPE SURG ORHT 6 SPLT 77X108 (DRAPES) ×2 IMPLANT
DRSG MEPILEX BORDER 4X12 (GAUZE/BANDAGES/DRESSINGS) IMPLANT
DRSG MEPILEX BORDER 4X8 (GAUZE/BANDAGES/DRESSINGS) IMPLANT
DURAPREP 26ML APPLICATOR (WOUND CARE) ×6 IMPLANT
ELECT BLADE 4.0 EZ CLEAN MEGAD (MISCELLANEOUS) ×3
ELECT BLADE 6.5 EXT (BLADE) ×3 IMPLANT
ELECT CAUTERY BLADE 6.4 (BLADE) ×3 IMPLANT
ELECT REM PT RETURN 9FT ADLT (ELECTROSURGICAL) ×3
ELECTRODE BLDE 4.0 EZ CLN MEGD (MISCELLANEOUS) ×2 IMPLANT
ELECTRODE REM PT RTRN 9FT ADLT (ELECTROSURGICAL) ×2 IMPLANT
EVACUATOR SILICONE 100CC (DRAIN) ×3 IMPLANT
GAUZE SPONGE 4X4 12PLY STRL (GAUZE/BANDAGES/DRESSINGS) ×3 IMPLANT
GAUZE SPONGE 4X4 16PLY XRAY LF (GAUZE/BANDAGES/DRESSINGS) ×6 IMPLANT
GLOVE BIO SURGEON STRL SZ7 (GLOVE) ×9 IMPLANT
GLOVE BIO SURGEON STRL SZ8 (GLOVE) ×6 IMPLANT
GLOVE BIOGEL M 8.0 STRL (GLOVE) ×3 IMPLANT
GLOVE BIOGEL PI IND STRL 7.0 (GLOVE) ×6 IMPLANT
GLOVE BIOGEL PI IND STRL 8 (GLOVE) ×6 IMPLANT
GLOVE BIOGEL PI INDICATOR 7.0 (GLOVE) ×3
GLOVE BIOGEL PI INDICATOR 8 (GLOVE) ×3
GOWN STRL REUS W/ TWL LRG LVL3 (GOWN DISPOSABLE) ×4 IMPLANT
GOWN STRL REUS W/ TWL XL LVL3 (GOWN DISPOSABLE) ×4 IMPLANT
GOWN STRL REUS W/TWL LRG LVL3 (GOWN DISPOSABLE) ×2
GOWN STRL REUS W/TWL XL LVL3 (GOWN DISPOSABLE) ×2
GUIDEWIRE BLUNT VIPER II 1.45 (WIRE) ×3 IMPLANT
GUIDEWIRE SHARP VIPER II (WIRE) ×18 IMPLANT
IV CATH 14GX2 1/4 (CATHETERS) ×3 IMPLANT
KIT BASIN OR (CUSTOM PROCEDURE TRAY) ×3 IMPLANT
KIT DILATOR XLIF 5 (KITS) ×2 IMPLANT
KIT POSITION SURG JACKSON T1 (MISCELLANEOUS) ×3 IMPLANT
KIT ROOM TURNOVER OR (KITS) ×3 IMPLANT
KIT SURGICAL ACCESS MAXCESS 4 (KITS) ×3 IMPLANT
KIT XLIF (KITS) ×1
MARKER SKIN DUAL TIP RULER LAB (MISCELLANEOUS) ×3 IMPLANT
MIX DBX 20CC MTF (Putty) ×3 IMPLANT
NEEDLE HYPO 25GX1X1/2 BEV (NEEDLE) ×3 IMPLANT
NEEDLE JAMSHIDI VIPER (NEEDLE) ×6 IMPLANT
NEEDLE SPNL 18GX3.5 QUINCKE PK (NEEDLE) ×6 IMPLANT
NEEDLE SSEP/EMG (NEEDLE) ×3 IMPLANT
NS IRRIG 1000ML POUR BTL (IV SOLUTION) ×3 IMPLANT
OIL CARTRIDGE MAESTRO DRILL (MISCELLANEOUS) ×3
PACK LAMINECTOMY ORTHO (CUSTOM PROCEDURE TRAY) ×3 IMPLANT
PACK UNIVERSAL I (CUSTOM PROCEDURE TRAY) ×6 IMPLANT
PAD ARMBOARD 7.5X6 YLW CONV (MISCELLANEOUS) ×12 IMPLANT
PATTIES SURGICAL .5 X1 (DISPOSABLE) ×3 IMPLANT
PATTIES SURGICAL .5 X3 (DISPOSABLE) IMPLANT
PATTIES SURGICAL .5X1.5 (GAUZE/BANDAGES/DRESSINGS) IMPLANT
PATTIES SURGICAL .75X.75 (GAUZE/BANDAGES/DRESSINGS) ×3 IMPLANT
PENCIL FOOT CONTROL (ELECTRODE) ×3 IMPLANT
ROD LORDOSED 75MM VIPER 2 (Rod) ×6 IMPLANT
SCREW SET SINGLE INNER MIS (Screw) ×18 IMPLANT
SCREW XTAB POLY VIPER  6X45 (Screw) ×2 IMPLANT
SCREW XTAB POLY VIPER  7X45 (Screw) ×2 IMPLANT
SCREW XTAB POLY VIPER 6X45 (Screw) ×4 IMPLANT
SCREW XTAB POLY VIPER 7X45 (Screw) ×4 IMPLANT
SLEEVE SURGEON STRL (DRAPES) ×3 IMPLANT
SPONGE INTESTINAL PEANUT (DISPOSABLE) ×6 IMPLANT
SPONGE LAP 4X18 X RAY DECT (DISPOSABLE) ×3 IMPLANT
SPONGE SURGIFOAM ABS GEL 100 (HEMOSTASIS) ×3 IMPLANT
STAPLER VISISTAT 35W (STAPLE) ×3 IMPLANT
STRIP CLOSURE SKIN 1/2X4 (GAUZE/BANDAGES/DRESSINGS) ×3 IMPLANT
SURGIFLO W/THROMBIN 8M KIT (HEMOSTASIS) ×3 IMPLANT
SUT MNCRL AB 4-0 PS2 18 (SUTURE) ×15 IMPLANT
SUT PROLENE 6 0 C 1 24 (SUTURE) IMPLANT
SUT VIC AB 0 CT1 18XCR BRD 8 (SUTURE) ×10 IMPLANT
SUT VIC AB 0 CT1 8-18 (SUTURE) ×5
SUT VIC AB 1 CT1 18XCR BRD 8 (SUTURE) ×4 IMPLANT
SUT VIC AB 1 CT1 8-18 (SUTURE) ×2
SUT VIC AB 2-0 CT2 18 VCP726D (SUTURE) ×12 IMPLANT
SYR 20CC LL (SYRINGE) ×3 IMPLANT
SYR BULB IRRIGATION 50ML (SYRINGE) ×3 IMPLANT
SYR CONTROL 10ML LL (SYRINGE) ×3 IMPLANT
SYR TB 1ML LUER SLIP (SYRINGE) ×3 IMPLANT
TAP CANN VIPER2 DL 5.0 (TAP) ×6 IMPLANT
TAP CANN VIPER2 DL 6.0 (TAP) ×6 IMPLANT
TOWEL OR 17X24 6PK STRL BLUE (TOWEL DISPOSABLE) ×3 IMPLANT
TOWEL OR 17X26 10 PK STRL BLUE (TOWEL DISPOSABLE) ×6 IMPLANT
TRAY FOLEY CATH 16FR SILVER (SET/KITS/TRAYS/PACK) ×3 IMPLANT
TRAY FOLEY CATH 16FRSI W/METER (SET/KITS/TRAYS/PACK) ×3 IMPLANT
TUBE CONNECTING 12X1/4 (SUCTIONS) ×3 IMPLANT
WATER STERILE IRR 1000ML POUR (IV SOLUTION) ×3 IMPLANT
YANKAUER SUCT BULB TIP NO VENT (SUCTIONS) ×3 IMPLANT

## 2015-01-10 NOTE — Op Note (Signed)
NAMECLEVLAND, CORK NO.:  [id number]  MEDICAL RECORD NO.:  [id number]  LOCATION:  3C06C                        FACILITY:  [facility]  PHYSICIAN:  Estill Bamberg, MD      DATE OF BIRTH:  Mar 03, 1975  DATE OF PROCEDURE:  01/10/2015                              OPERATIVE REPORT   PREOPERATIVE DIAGNOSES: 1. Severe axial low back pain. 2. Degenerative disk disease, L3-4, L4-5, with a diskogram notable for     concordant pain at L3-4 and L4-5 with a negative control at L5-S1.  POSTOPERATIVE DIAGNOSES: 1. Severe axial low back pain. 2. Degenerative disk disease, L3-4, L4-5, with a diskogram notable for     concordant pain at L3-4 and L4-5 with a negative control at L5-S1.  PROCEDURE: 1. Anterior lumbar interbody fusion via a right-sided direct lateral     approach, L3-4, L4-5. 2. Insertion of interbody device x2 (NuVasive intervertebral     spacers). 3. Use of morselized allograft-DBX mix. 4. Posterior spinal fusion, L4-5, L5-S1. 5. Placement of posterior segmental instrumentation, L4-S1.  SURGEON:  Estill Bamberg, MD  ASSISTANT:  [name], PA-C  ANESTHESIA:  General endotracheal anesthesia.  COMPLICATIONS:  None.  DISPOSITION:  Stable.  ESTIMATED BLOOD LOSS:  Minimal.  INDICATIONS FOR SURGERY:  Briefly, Mr. Becker is a very pleasant 39- year-old male, who did present to me initially with pain in his bilateral legs and low back.  The patient is status post a decompressive procedure.  The patient ultimately did well, and did continue to have ongoing and severe pain in the low back.  He did feel this pain was severe and debilitating.  We therefore did discuss proceeding with a diskogram, and potentially proceeding with a fusion procedure.  A diskogram was in fact performed, and was clearly notable for concordant pain at L3-4 and L4-5 with a negative control at L5-S1.  We therefore did discuss proceeding with the procedure reflected above.  The  patient did fully understand the risks and limitations of the procedure, and did elect to proceed.  OPERATIVE DETAILS:  On January 10, 2015, the patient was brought to surgery and general endotracheal anesthesia was administered.  Of note, a  neurologic monitoring technician was utilized and all the appropriate leads were placed by him.  The patient was then placed in the lateral decubitus position with the right side up.  The patient's hips and knees were flexed, in order to relax the psoas musculature.  All bony prominences were meticulously padded.  The region of the right flank was then prepped and draped in the usual sterile fashion.  Time-out procedure was performed.  I then made a transverse incision in between the L4-5 and L3-4 intervertebral spaces.  The external and internal oblique musculature was dissected, as was the transversalis fascia.  The retroperitoneal space was identified.  The peritoneum was bluntly swept anteriorly.  I then advanced the initial dilator through the psoas, which was docked over the L4-5 intervertebral space.  I did use triggered EMG while advancing the initial dilator, and there was no neurologic structures noted to be in the immediate vicinity of the dilator.  I then sequentially dilated and a self-retaining retractor  was placed which was anchored to the bed using a rigid arm.  Of note, I did liberally use AP and lateral fluoroscopy while placing the dilator.  The dilator was gently opened.  The L4-5 intervertebral disk was identified and a thorough and complete intervertebral diskectomy was performed using a series of curettes and paddle scrapers, and pituitary rongeurs. I was very pleased with the diskectomy that I was able to accomplish.  I then placed a series of trials and I did feel that a 14 mm lordotic intervertebral spacer would be the most appropriate fit.  The appropriate size intervertebral spacer was then packed with DBX mix and tamped  into position in the usual fashion.  I was very pleased with the final appearance of the intervertebral spacer.  The retractor was then removed.  I then again dissected through the external and internal oblique musculature at a more superior level.  Once again, peritoneum was reflected anteriorly and the initial dilator was advanced over the L3-4 intervertebral space.  Again, triggered EMG was used and there were no neurologic structures in the vicinity of the dilator.  I then again performed a thorough and complete L3-4 intervertebral diskectomy.  The endplates were prepared and the appropriate size interbody spacer was then packed with DBX mix and tamped into position.  At this particular level, I did feel that a parallel 12 mm spacer would be the most appropriate fit.  Of note, there was no abnormal sustained EMG activity noted throughout the entire procedure.  There was noted to be intermittent activity of the left gastroc, however, it was noted by the technician that this was secondary to lead that needed to be repositioned.  The wound was then copiously irrigated and the fascia was then closed using #1 Vicryl.  The subcutaneous layer was then closed using 2-0 Vicryl and the skin was closed using 3-0 Monocryl.  Sterile dressing was then applied.  The patient was then placed prone onto a well-padded flat Jackson bed.  The back was then prepped and draped in the usual fashion.  Again, antibiotics were given, and again, time-out procedure was performed.  I then marked out the L3, L4, and L5 pedicles. I then made paramedian incisions on the right and on the left sides. The left posterolateral gutter was identified and decorticated, including the L3-4 and L4-5 facet joints and the posterolateral gutter. I then packed the remainder of the DBX mix into the left posterolateral gutter to help aid in the success of the posterolateral fusion.  I then cannulated the L3, L4, and L5 pedicles  bilaterally.  I then used a 5 mm tap at L3 bilaterally, and a 6 mm tap at L4 and L5 bilaterally.  I then advanced a 6 x 45 mm screw bilaterally at L3, and a 7 x 45 mm screw bilaterally at L4 and at L5.  A 75 mm rod was then secured into the heads of the screws.  Caps were then placed and a final locking procedure was performed.  I was very pleased with the press-fit of the screws.  I was very pleased with the purchase of the screws and I was very pleased with the final AP and lateral fluoroscopic images.  The wound was then copiously irrigated.  The bilateral wounds were then closed in layers using #1 Vicryl followed by 2-0 Vicryl, followed by 3-0 Monocryl.  Benzoin and Steri-Strips were applied followed by sterile dressing.  Of note, I did use triggered EMG to test each  of the pedicle screws, and there was no screw that tested below 20 milliamps.  The patient was then awoken from general endotracheal anesthesia and transferred to recovery in stable condition.  Of note, [name] was my assistant throughout surgery, and did aid in retraction and suctioning throughout the entire anterior and posterior portion of the procedure, and she did also aid in closure.     Estill Bamberg, MD     MD/MEDQ  D:  01/10/2015  T:  01/10/2015  Job:  [id number]

## 2015-01-10 NOTE — Anesthesia Procedure Notes (Signed)
Procedure Name: Intubation Date/Time: 01/10/2015 7:41 AM Performed by: Rogelia BogaMUELLER, Andruw Battie P Pre-anesthesia Checklist: Patient identified, Emergency Drugs available, Suction available, Patient being monitored and Timeout performed Patient Re-evaluated:Patient Re-evaluated prior to inductionOxygen Delivery Method: Circle system utilized Preoxygenation: Pre-oxygenation with 100% oxygen Intubation Type: IV induction Laryngoscope Size: Mac and 4 Grade View: Grade II Tube type: Oral Tube size: 7.5 mm Number of attempts: 1 Airway Equipment and Method: Stylet Placement Confirmation: ETT inserted through vocal cords under direct vision,  positive ETCO2 and breath sounds checked- equal and bilateral Secured at: 22 cm Tube secured with: Tape Dental Injury: Teeth and Oropharynx as per pre-operative assessment

## 2015-01-10 NOTE — Anesthesia Preprocedure Evaluation (Addendum)
Anesthesia Evaluation  Patient identified by MRN, date of birth, ID band Patient awake    Reviewed: Allergy & Precautions, NPO status , Patient's Chart, lab work & pertinent test results  History of Anesthesia Complications Negative for: history of anesthetic complications  Airway Mallampati: II  TM Distance: >3 FB Neck ROM: Full    Dental  (+) Teeth Intact, Dental Advisory Given   Pulmonary Current Smoker,    breath sounds clear to auscultation       Cardiovascular hypertension,  Rhythm:Regular     Neuro/Psych  Headaches,  Neuromuscular disease negative psych ROS   GI/Hepatic negative GI ROS, Neg liver ROS,   Endo/Other  Morbid obesity  Renal/GU negative Renal ROS     Musculoskeletal negative musculoskeletal ROS (+)   Abdominal   Peds  Hematology negative hematology ROS (+)   Anesthesia Other Findings   Reproductive/Obstetrics                            Anesthesia Physical Anesthesia Plan  ASA: II  Anesthesia Plan: General   Post-op Pain Management:    Induction: Intravenous  Airway Management Planned: Oral ETT  Additional Equipment: None  Intra-op Plan:   Post-operative Plan: Extubation in OR  Informed Consent: I have reviewed the patients History and Physical, chart, labs and discussed the procedure including the risks, benefits and alternatives for the proposed anesthesia with the patient or authorized representative who has indicated his/her understanding and acceptance.   Dental advisory given  Plan Discussed with: CRNA and Surgeon  Anesthesia Plan Comments:        Anesthesia Quick Evaluation

## 2015-01-10 NOTE — Transfer of Care (Signed)
Immediate Anesthesia Transfer of Care Note  Patient: Richardo PriestJerry Adcox  Procedure(s) Performed: Procedure(s) with comments: ANTERIOR LATERAL LUMBAR FUSION 2 LEVELS (Right) - Right sided lateral interbody fusion, lumbar 3-4, lumbar 4-5 POSTERIOR LUMBAR FUSION 2 LEVEL (N/A) - Posterior spinal fusion, lumbar 3-4, lumbar 4-5 with instrumentation and allograft  Patient Location: PACU  Anesthesia Type:General  Level of Consciousness: awake, alert , oriented and patient cooperative  Airway & Oxygen Therapy: Patient Spontanous Breathing and Patient connected to nasal cannula oxygen  Post-op Assessment: Report given to RN, Post -op Vital signs reviewed and stable and Patient moving all extremities X 4  Post vital signs: Reviewed and stable  Last Vitals:  Filed Vitals:   01/10/15 0614 01/10/15 1335  BP: 145/76 139/88  Pulse: 80   Temp: 36.4 C 37.4 C  Resp: 20     Complications: No apparent anesthesia complications

## 2015-01-11 ENCOUNTER — Encounter (HOSPITAL_COMMUNITY): Payer: Self-pay | Admitting: Orthopedic Surgery

## 2015-01-11 NOTE — Progress Notes (Signed)
Pt doing well. Pt and wife given D/C instructions with Rx's, verbal understanding was provided. Pt's IV was removed prior to D/C. Pt's incisions are clean and dry with no sign of infection. Pt received 3-n-1 prior to D/C. Pt D/C'd home via wheelchair @ [phone] per MD order. Pt is stable @ D/C and has no other needs at this time. Rema FendtAshley Mansfield Dann, RN

## 2015-01-11 NOTE — Evaluation (Signed)
Physical Therapy Evaluation and Discharge Patient Details Name: Andrew PriestJerry Bean MRN: [id number] DOB: 10/09/1975 Today's Date: 01/11/2015   History of Present Illness  Pt is a 39 y/o male who presents s/p L3-L5 A/P lumbar fusion on 01/10/15.  Clinical Impression  Patient evaluated by Physical Therapy with no further acute PT needs identified. All education has been completed and the patient has no further questions. At the time of PT eval pt was able to perform transfers and ambulation with mod I. Discussed need for 3-in-1 and pt agreeable. RN notified. See below for any follow-up Physical Therapy or equipment needs. PT is signing off. Thank you for this referral.     Follow Up Recommendations Outpatient PT (When appropriate per post-op protocol)    Equipment Recommendations  3in1 (PT)    Recommendations for Other Services       Precautions / Restrictions Precautions Precautions: Fall;Back Precaution Booklet Issued: Yes (comment) Precaution Comments: Reviewed handout and educated pt on 3/3 back precautions.  Required Braces or Orthoses: Spinal Brace Spinal Brace: Thoracolumbosacral orthotic;Applied in sitting position Restrictions Weight Bearing Restrictions: No      Mobility  Bed Mobility               General bed mobility comments: Pt sitting up on EOB when PT arrived. Reviewed log roll technique.   Transfers Overall transfer level: Modified independent Equipment used: None Transfers: Sit to/from Stand           General transfer comment: No assist required.   Ambulation/Gait Ambulation/Gait assistance: Modified independent (Device/Increase time) Ambulation Distance (Feet): 250 Feet Assistive device: None Gait Pattern/deviations: Step-through pattern;Decreased stride length (Knees bent due to pain on RLE) Gait velocity: Decreased Gait velocity interpretation: Below normal speed for age/gender General Gait Details: Grossly antalgic due to RLE pain but no assist  required.   Stairs            Wheelchair Mobility    Modified Rankin (Stroke Patients Only)       Balance Overall balance assessment: No apparent balance deficits (not formally assessed)                                           Pertinent Vitals/Pain Pain Assessment: 0-10 Pain Score: 8  Pain Location: Back Pain Descriptors / Indicators: Operative site guarding;Discomfort Pain Intervention(s): Monitored during session;Repositioned;Patient requesting pain meds-RN notified;RN gave pain meds during session;Limited activity within patient's tolerance    Home Living Family/patient expects to be discharged to:: Private residence Living Arrangements: Spouse/significant other Available Help at Discharge: Family Type of Home: House Home Access: Level entry     Home Layout: One level Home Equipment: Environmental consultantWalker - 2 wheels;Grab bars - tub/shower      Prior Function Level of Independence: Independent               Hand Dominance   Dominant Hand: Right    Extremity/Trunk Assessment   Upper Extremity Assessment: Defer to OT evaluation           Lower Extremity Assessment: RLE deficits/detail RLE Deficits / Details: Acute pain - pt reports MD had to go through muscle to place hardware.    Cervical / Trunk Assessment: Normal (s/p surgery)  Communication   Communication: No difficulties  Cognition Arousal/Alertness: Awake/alert Behavior During Therapy: WFL for tasks assessed/performed Overall Cognitive Status: Within Functional Limits for tasks assessed  General Comments      Exercises        Assessment/Plan    PT Assessment Patent does not need any further PT services  PT Diagnosis Difficulty walking;Acute pain   PT Problem List    PT Treatment Interventions     PT Goals (Current goals can be found in the Care Plan section) Acute Rehab PT Goals Patient Stated Goal: Return home this morning PT Goal  Formulation: With patient Time For Goal Achievement: 01/18/15 Potential to Achieve Goals: Good    Frequency     Barriers to discharge        Co-evaluation               End of Session Equipment Utilized During Treatment: Back brace Activity Tolerance: Patient tolerated treatment well;Patient limited by pain Patient left: with call bell/phone within reach (Sitting EOB) Nurse Communication: Mobility status         Time: 1610-9604 PT Time Calculation (min) (ACUTE ONLY): 14 min   Charges:   PT Evaluation $Initial PT Evaluation Tier I: 1 Procedure     PT G CodesConni Slipper January 16, 2015, 9:01 AM   Conni Slipper, PT, DPT Acute Rehabilitation Services Pager: [phone]

## 2015-01-11 NOTE — Progress Notes (Signed)
    Patient doing well + expected moderate pain pain   Physical Exam: Filed Vitals:   01/10/15 2319 01/11/15 0324  BP: 128/75 141/72  Pulse: 102 97  Temp: 97.6 F (36.4 C) 98.2 F (36.8 C)  Resp: 18 18    Dressing in place NVI + pain with active flexion of R hip Patient ambulating in the hall, appears comfortable  POD #1 s/p A/P L3-5 lumbar fusion, doing well. Patient has expected psoas discomfort  - up with PT/OT, encourage ambulation - Percocet for pain, Valium for muscle spasms - likely d/c home today vs tomorrow - brace when up and ambulating - f/u in 2 weeks

## 2015-01-11 NOTE — Evaluation (Addendum)
Occupational Therapy Evaluation Patient Details Name: Andrew Bean MRN: [id number] DOB: 06-28-1975 Today's Date: 01/11/2015    History of Present Illness Pt is a 39 y/o male who presents s/p L3-L5 A/P lumbar fusion on 01/10/15.   Clinical Impression   Pt reports he was independent with ADLs PTA. Currently pt is overall supervision for ADLs and mobility with the exception of mod assist for LB ADLs. Pt reports wife can provide assist with ADLs as needed. All education complete; pt with no further questions or concerns for OT at this time. Pt ready to d/c from an OT standpoint; signing off at this time. Thank you for this referral.     Follow Up Recommendations  No OT follow up;Supervision - Intermittent    Equipment Recommendations  3 in 1 bedside comode    Recommendations for Other Services       Precautions / Restrictions Precautions Precautions: Fall;Back Precaution Booklet Issued: Yes (comment) Precaution Comments: Pt able to recall 2/3 precautions at start of session. Educated on all precautions Required Braces or Orthoses: Spinal Brace Spinal Brace: Thoracolumbosacral orthotic;Applied in sitting position Restrictions Weight Bearing Restrictions: No      Mobility Bed Mobility               General bed mobility comments: Pt sitting EOB upon arrival  Transfers Overall transfer level: Needs assistance Equipment used: None Transfers: Sit to/from Stand Sit to Stand: Supervision         General transfer comment: No assist required.     Balance Overall balance assessment: No apparent balance deficits (not formally assessed)                                          ADL Overall ADL's : Needs assistance/impaired Eating/Feeding: Set up;Sitting   Grooming: Supervision/safety;Wash/dry hands;Standing   Upper Body Bathing: Supervision/ safety;Sitting;Standing   Lower Body Bathing: Moderate assistance;Sit to/from stand   Upper Body Dressing :  Supervision/safety;Sitting;Standing   Lower Body Dressing: Moderate assistance;Sit to/from stand Lower Body Dressing Details (indicate cue type and reason): Educated on compensatory strategies; pt unable to cross one leg over the other. Discussed use of AE; pt reports wife will assist as needed. Toilet Transfer: Supervision/safety;Ambulation;BSC Toilet Transfer Details (indicate cue type and reason): Educated on use of 3 in 1 over toilet Toileting- Architect and Hygiene: Supervision/safety;Sit to/from stand Toileting - Clothing Manipulation Details (indicate cue type and reason): Educated on maintaining back precautions during toilet hygiene, use of toilet tongs; pt verbalized understanding. Tub/ Shower Transfer: Supervision/safety;Tub transfer;Ambulation;3 in 1 Tub/Shower Transfer Details (indicate cue type and reason): Educated pt on use of 3 in 1 in tub for seat, provided with handout; pt verbalized understanding. Functional mobility during ADLs: Supervision/safety General ADL Comments: Wife and son present at end of session. Educated on compensatory strategies for ADLs, maintaining back precautions during functional activities, need for supervision during ADLs, home safety; pt verbalized understanding.     Vision     Perception     Praxis      Pertinent Vitals/Pain Pain Assessment: 0-10 Pain Score: 7  Pain Location: back Pain Descriptors / Indicators: Discomfort;Grimacing;Sore Pain Intervention(s): Limited activity within patient's tolerance;Monitored during session;Premedicated before session     Hand Dominance Right   Extremity/Trunk Assessment Upper Extremity Assessment Upper Extremity Assessment: Overall WFL for tasks assessed   Lower Extremity Assessment Lower Extremity Assessment: Defer to PT evaluation  RLE Deficits / Details: Acute pain - pt reports MD had to go through muscle to place hardware.   Cervical / Trunk Assessment Cervical / Trunk Assessment:  Normal (s/p surgery)   Communication Communication Communication: No difficulties   Cognition Arousal/Alertness: Awake/alert Behavior During Therapy: WFL for tasks assessed/performed Overall Cognitive Status: Within Functional Limits for tasks assessed                     General Comments       Exercises       Shoulder Instructions      Home Living Family/patient expects to be discharged to:: Private residence Living Arrangements: Spouse/significant other Available Help at Discharge: Family Type of Home: House Home Access: Level entry     Home Layout: One level     Bathroom Shower/Tub: Chief Strategy OfficerTub/shower unit   Bathroom Toilet: Standard Bathroom Accessibility: Yes   Home Equipment: Environmental consultantWalker - 2 wheels;Grab bars - tub/shower          Prior Functioning/Environment Level of Independence: Independent             OT Diagnosis: Acute pain   OT Problem List:     OT Treatment/Interventions:      OT Goals(Current goals can be found in the care plan section) Acute Rehab OT Goals Patient Stated Goal: to go home today OT Goal Formulation: With patient/family  OT Frequency:     Barriers to D/C:            Co-evaluation              End of Session Equipment Utilized During Treatment: Back brace Nurse Communication: Other (comment) (pt needs 3 in 1)  Activity Tolerance: Patient tolerated treatment well Patient left: with call bell/phone within reach;with family/visitor present (sitting EOB)   Time: 7829-5621: 0828-0842 OT Time Calculation (min): 14 min Charges:  OT General Charges $OT Visit: 1 Procedure OT Evaluation $Initial OT Evaluation Tier I: 1 Procedure G-Codes:     Gaye AlkenBailey A Banjamin Stovall M.S., OTR/L Pager: [phone]  01/11/2015, 10:16 AM

## 2015-01-15 NOTE — Anesthesia Postprocedure Evaluation (Signed)
Anesthesia Post Note  Patient: Andrew Bean  Procedure(s) Performed: Procedure(s) (LRB): ANTERIOR LATERAL LUMBAR FUSION 2 LEVELS (Right) POSTERIOR LUMBAR FUSION 2 LEVEL (N/A)  Patient location during evaluation: PACU Anesthesia Type: General Level of consciousness: awake Pain management: pain level controlled Vital Signs Assessment: post-procedure vital signs reviewed and stable Respiratory status: spontaneous breathing Cardiovascular status: stable Postop Assessment: no signs of nausea or vomiting Anesthetic complications: no    Last Vitals:  Filed Vitals:   01/11/15 0324 01/11/15 0819  BP: 141/72 152/90  Pulse: 97 109  Temp: 36.8 C 36.9 C  Resp: 18 18    Last Pain:  Filed Vitals:   01/11/15 0849  PainSc: 8                  [name]

## 2015-01-16 ENCOUNTER — Encounter (HOSPITAL_COMMUNITY): Payer: Self-pay | Admitting: Orthopedic Surgery
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
## 2015-09-03 ENCOUNTER — Encounter: Payer: Self-pay | Admitting: Osteopathic Medicine

## 2015-09-03 ENCOUNTER — Ambulatory Visit (INDEPENDENT_AMBULATORY_CARE_PROVIDER_SITE_OTHER): Payer: Managed Care, Other (non HMO) | Admitting: Osteopathic Medicine

## 2015-09-03 VITALS — BP 144/82 | HR 96 | Ht 73.0 in | Wt 236.0 lb

## 2015-09-03 DIAGNOSIS — R252 Cramp and spasm: Secondary | ICD-10-CM

## 2015-09-03 DIAGNOSIS — F17200 Nicotine dependence, unspecified, uncomplicated: Secondary | ICD-10-CM | POA: Diagnosis not present

## 2015-09-03 DIAGNOSIS — R030 Elevated blood-pressure reading, without diagnosis of hypertension: Secondary | ICD-10-CM | POA: Diagnosis not present

## 2015-09-03 DIAGNOSIS — IMO0001 Reserved for inherently not codable concepts without codable children: Secondary | ICD-10-CM | POA: Insufficient documentation

## 2015-09-03 DIAGNOSIS — G4700 Insomnia, unspecified: Secondary | ICD-10-CM | POA: Diagnosis not present

## 2015-09-03 DIAGNOSIS — G2581 Restless legs syndrome: Secondary | ICD-10-CM | POA: Insufficient documentation

## 2015-09-03 DIAGNOSIS — Z9889 Other specified postprocedural states: Secondary | ICD-10-CM

## 2015-09-03 DIAGNOSIS — M961 Postlaminectomy syndrome, not elsewhere classified: Secondary | ICD-10-CM | POA: Insufficient documentation

## 2015-09-03 LAB — COMPLETE METABOLIC PANEL WITH GFR
ALT: 31 U/L (ref 9–46)
AST: 21 U/L (ref 10–40)
Albumin: 4.3 g/dL (ref 3.6–5.1)
Alkaline Phosphatase: 78 U/L (ref 40–115)
BUN: 11 mg/dL (ref 7–25)
CO2: 26 mmol/L (ref 20–31)
Calcium: 9.2 mg/dL (ref 8.6–10.3)
Chloride: 105 mmol/L (ref 98–110)
Creat: 0.89 mg/dL (ref 0.60–1.35)
GFR, Est African American: >89 mL/min (ref >=60)
GLUCOSE: 89 mg/dL (ref 65–99)
POTASSIUM: 4.5 mmol/L (ref 3.5–5.3)
SODIUM: 139 mmol/L (ref 135–146)
Total Bilirubin: 0.5 mg/dL (ref 0.2–1.2)
Total Protein: 6.6 g/dL (ref 6.1–8.1)

## 2015-09-03 LAB — CBC WITH DIFFERENTIAL/PLATELET
BASOS ABS: 0 {cells}/uL (ref 0–200)
Basophils Relative: 0 %
EOS PCT: 4 %
Eosinophils Absolute: 360 cells/uL (ref 15–500)
HCT: 47.5 % (ref 38.5–50.0)
Hemoglobin: 16.4 g/dL (ref 13.2–17.1)
Lymphocytes Relative: 38 %
Lymphs Abs: 3420 cells/uL (ref 850–3900)
MCH: 32.5 pg (ref 27.0–33.0)
MCHC: 34.5 g/dL (ref 32.0–36.0)
MCV: 94.1 fL (ref 80.0–100.0)
MONOS PCT: 7 %
MPV: 9.9 fL (ref 7.5–12.5)
Monocytes Absolute: 630 cells/uL (ref 200–950)
NEUTROS ABS: 4590 {cells}/uL (ref 1500–7800)
NEUTROS PCT: 51 %
PLATELETS: 285 10*3/uL (ref 140–400)
RBC: 5.05 MIL/uL (ref 4.20–5.80)
RDW: 13.4 % (ref 11.0–15.0)
WBC: 9 10*3/uL (ref 3.8–10.8)

## 2015-09-03 LAB — TSH: TSH: 1.31 mIU/L (ref 0.40–4.50)

## 2015-09-03 MED ORDER — ROPINIROLE HCL 0.5 MG PO TABS: ORAL_TABLET | ORAL | 1 refills | Status: DC

## 2015-09-03 MED ORDER — LOSARTAN POTASSIUM 25 MG PO TABS: 25.0000 mg | ORAL_TABLET | Freq: Every day | ORAL | 1 refills | Status: DC
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
## 2015-09-03 NOTE — Patient Instructions (Signed)
RESTLESS LEGS - see Rx bottle instructions. We will plan to increase this dose slowly. May take up to a few weeks to get to effective dose.   BLOOD PRESSURE - start medications as directed.   Plan to recheck on medication doses and effectiveness in 2 weeks. Please call sooner if any problems!

## 2015-09-17 ENCOUNTER — Encounter: Payer: Self-pay | Admitting: Osteopathic Medicine

## 2015-09-17 ENCOUNTER — Ambulatory Visit (INDEPENDENT_AMBULATORY_CARE_PROVIDER_SITE_OTHER): Payer: Managed Care, Other (non HMO) | Admitting: Osteopathic Medicine

## 2015-09-17 VITALS — BP 122/89 | HR 85 | Ht 73.0 in | Wt 238.0 lb

## 2015-09-17 DIAGNOSIS — E785 Hyperlipidemia, unspecified: Secondary | ICD-10-CM

## 2015-09-17 DIAGNOSIS — M5136 Other intervertebral disc degeneration, lumbar region: Secondary | ICD-10-CM

## 2015-09-17 DIAGNOSIS — M4806 Spinal stenosis, lumbar region: Secondary | ICD-10-CM

## 2015-09-17 DIAGNOSIS — Z9889 Other specified postprocedural states: Secondary | ICD-10-CM

## 2015-09-17 DIAGNOSIS — M5416 Radiculopathy, lumbar region: Secondary | ICD-10-CM

## 2015-09-17 DIAGNOSIS — G2581 Restless legs syndrome: Secondary | ICD-10-CM | POA: Diagnosis not present

## 2015-09-17 DIAGNOSIS — I10 Essential (primary) hypertension: Secondary | ICD-10-CM | POA: Insufficient documentation

## 2015-09-17 DIAGNOSIS — M48061 Spinal stenosis, lumbar region without neurogenic claudication: Secondary | ICD-10-CM

## 2015-09-17 MED ORDER — ROPINIROLE HCL 2 MG PO TABS: 2.0000 mg | ORAL_TABLET | Freq: Every day | ORAL | 1 refills | Status: DC

## 2015-09-17 NOTE — Patient Instructions (Signed)
Contact me in 1 - 2 weeks to let me know how the meds are working on the higher dose
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
## 2017-02-08 ENCOUNTER — Encounter (INDEPENDENT_AMBULATORY_CARE_PROVIDER_SITE_OTHER): Payer: Self-pay

## 2017-02-08 ENCOUNTER — Encounter: Payer: Self-pay | Admitting: Physician Assistant

## 2017-02-08 ENCOUNTER — Ambulatory Visit (INDEPENDENT_AMBULATORY_CARE_PROVIDER_SITE_OTHER): Payer: BLUE CROSS/BLUE SHIELD | Admitting: Physician Assistant

## 2017-02-08 VITALS — BP 120/70 | HR 70 | Ht 73.0 in | Wt 208.0 lb

## 2017-02-08 DIAGNOSIS — M5136 Other intervertebral disc degeneration, lumbar region: Secondary | ICD-10-CM

## 2017-02-08 DIAGNOSIS — F321 Major depressive disorder, single episode, moderate: Secondary | ICD-10-CM

## 2017-02-08 DIAGNOSIS — Z131 Encounter for screening for diabetes mellitus: Secondary | ICD-10-CM | POA: Diagnosis not present

## 2017-02-08 DIAGNOSIS — M5416 Radiculopathy, lumbar region: Secondary | ICD-10-CM

## 2017-02-08 DIAGNOSIS — Z0000 Encounter for general adult medical examination without abnormal findings: Secondary | ICD-10-CM | POA: Diagnosis not present

## 2017-02-08 DIAGNOSIS — I10 Essential (primary) hypertension: Secondary | ICD-10-CM

## 2017-02-08 DIAGNOSIS — M48061 Spinal stenosis, lumbar region without neurogenic claudication: Secondary | ICD-10-CM | POA: Diagnosis not present

## 2017-02-08 DIAGNOSIS — Z125 Encounter for screening for malignant neoplasm of prostate: Secondary | ICD-10-CM | POA: Diagnosis not present

## 2017-02-08 DIAGNOSIS — Z13220 Encounter for screening for lipoid disorders: Secondary | ICD-10-CM | POA: Diagnosis not present

## 2017-02-08 DIAGNOSIS — Z9889 Other specified postprocedural states: Secondary | ICD-10-CM

## 2017-02-08 MED ORDER — CYCLOBENZAPRINE HCL 10 MG PO TABS: 10.0000 mg | ORAL_TABLET | Freq: Three times a day (TID) | ORAL | 2 refills | Status: DC | PRN

## 2017-02-08 MED ORDER — DICLOFENAC SODIUM 75 MG PO TBEC: 75.0000 mg | DELAYED_RELEASE_TABLET | Freq: Two times a day (BID) | ORAL | 2 refills | Status: DC

## 2017-02-08 MED ORDER — AMITRIPTYLINE HCL 50 MG PO TABS: 50.0000 mg | ORAL_TABLET | Freq: Every day | ORAL | 2 refills | Status: DC

## 2017-02-08 NOTE — Progress Notes (Signed)
Subjective:    Patient ID: Andrew PriestJerry Bean, male    DOB: 05/27/1975, 42 y.o.   MRN: [id number]  HPI  Pt is a 42 yo male who presents to the clinic o establish care. I see his wife. Pt has PMH of back pain that started 3 years ago with a work injury. He has had laminectomy and back fusion surgery. He is in daily constant pain. A nigh he feels like it is the worst. He has a lot of muscle spasms as well. He is filing for disability. He has lost 60lbs to see if will help with back pain.  He was followed by Dr. Yevette Edwardsumonski before being released. He has tried PT, gabapentin, lyrica, cymbala without any help.   .. Active Ambulatory Problems    Diagnosis Date Noted  . Radiculopathy of lumbar region 08/19/2010  . Spinal stenosis at L4-L5 level 05/24/2014  . Degenerative disc disease, lumbar 01/10/2015  . Elevated blood pressure 09/03/2015  . Cramp of both lower extremities 09/03/2015  . Tobacco dependence 09/03/2015  . H/O laminectomy 09/03/2015  . Insomnia 09/03/2015  . Restless leg syndrome, uncontrolled 09/03/2015  . Essential hypertension, benign 09/17/2015  . Depression, major, single episode, moderate (HCC) 02/14/2017   Resolved Ambulatory Problems    Diagnosis Date Noted  . No Resolved Ambulatory Problems   Past Medical History:  Diagnosis Date  . Headache   . Hyperlipemia   . Hypertension         Review of Systems See HPI.     Objective:   Physical Exam  Constitutional: He is oriented to person, place, and time. He appears well-developed and well-nourished.  HENT:  Head: Normocephalic and atraumatic.  Cardiovascular: Normal rate, regular rhythm and normal heart sounds.  Pulmonary/Chest: Effort normal and breath sounds normal.  Musculoskeletal:  Decreased ROM at waist. Tenderness to palpation over the lumbar spine.   Neurological: He is alert and oriented to person, place, and time.  Psychiatric: He has a normal mood and affect. His behavior is normal.           Assessment & Plan:  .Marland Kitchen.Dorene SorrowJerry was seen today for back pain and insomnia.  Diagnoses and all orders for this visit:  Radiculopathy of lumbar region -     amitriptyline (ELAVIL) 50 MG tablet; Take 1 tablet (50 mg total) by mouth at bedtime. -     diclofenac (VOLTAREN) 75 MG EC tablet; Take 1 tablet (75 mg total) by mouth 2 (two) times daily. -     cyclobenzaprine (FLEXERIL) 10 MG tablet; Take 1 tablet (10 mg total) by mouth 3 (three) times daily as needed for muscle spasms.  Degenerative disc disease, lumbar -     amitriptyline (ELAVIL) 50 MG tablet; Take 1 tablet (50 mg total) by mouth at bedtime. -     diclofenac (VOLTAREN) 75 MG EC tablet; Take 1 tablet (75 mg total) by mouth 2 (two) times daily. -     cyclobenzaprine (FLEXERIL) 10 MG tablet; Take 1 tablet (10 mg total) by mouth 3 (three) times daily as needed for muscle spasms.  Essential hypertension, benign  Spinal stenosis at L4-L5 level -     amitriptyline (ELAVIL) 50 MG tablet; Take 1 tablet (50 mg total) by mouth at bedtime. -     diclofenac (VOLTAREN) 75 MG EC tablet; Take 1 tablet (75 mg total) by mouth 2 (two) times daily. -     cyclobenzaprine (FLEXERIL) 10 MG tablet; Take 1 tablet (10 mg total) by mouth  3 (three) times daily as needed for muscle spasms.  Screening for lipid disorders -     Lipid Panel w/reflex Direct LDL  Screening for diabetes mellitus -     COMPLETE METABOLIC PANEL WITH GFR  Screening for prostate cancer -     PSA  Preventative health care -     Lipid Panel w/reflex Direct LDL -     COMPLETE METABOLIC PANEL WITH GFR -     PSA -     CBC  H/O laminectomy  Other orders -     Cancel: traMADol (ULTRAM) 50 MG tablet; Take 1 tablet (50 mg total) by mouth every 8 (eight) hours as needed.  .. Depression screen [facility] 2/9 02/08/2017 09/03/2015  Decreased Interest 0 0  Down, Depressed, Hopeless 3 1  PHQ - 2 Score 3 1  Altered sleeping 3 2  Tired, decreased energy 3 0  Change in appetite 2 0  Feeling bad  or failure about yourself  2 0  Trouble concentrating 2 0  Moving slowly or fidgety/restless 0 0  Suicidal thoughts 1 0  PHQ-9 Score 16 3    Pt does not want any controlled substance. Offered tramadol but patient declined.   Will try elavil, diclofenac, flexeril. Discussed side effects. Follow up in 2 months.   Pt feels like if pain can be helped his mood will follow. Declines any medication treatment for mood.   Marland Kitchen.Spent 30 minutes with patient and greater than 50 percent of visit spent counseling patient regarding treatment plan.

## 2017-02-14 DIAGNOSIS — F321 Major depressive disorder, single episode, moderate: Secondary | ICD-10-CM | POA: Insufficient documentation

## 2017-03-02 DIAGNOSIS — Z0000 Encounter for general adult medical examination without abnormal findings: Secondary | ICD-10-CM | POA: Diagnosis not present

## 2017-03-02 DIAGNOSIS — Z125 Encounter for screening for malignant neoplasm of prostate: Secondary | ICD-10-CM | POA: Diagnosis not present

## 2017-03-02 DIAGNOSIS — Z131 Encounter for screening for diabetes mellitus: Secondary | ICD-10-CM | POA: Diagnosis not present

## 2017-03-02 DIAGNOSIS — Z13220 Encounter for screening for lipoid disorders: Secondary | ICD-10-CM | POA: Diagnosis not present

## 2017-03-04 ENCOUNTER — Encounter: Payer: Self-pay | Admitting: Physician Assistant

## 2017-03-04 DIAGNOSIS — E785 Hyperlipidemia, unspecified: Secondary | ICD-10-CM | POA: Insufficient documentation

## 2017-03-04 LAB — COMPLETE METABOLIC PANEL WITH GFR
AG RATIO: 1.9 (calc) (ref 1.0–2.5)
ALBUMIN MSPROF: 4.4 g/dL (ref 3.6–5.1)
ALT: 20 U/L (ref 9–46)
AST: 15 U/L (ref 10–40)
Alkaline phosphatase (APISO): 78 U/L (ref 40–115)
BUN: 10 mg/dL (ref 7–25)
CALCIUM: 9.3 mg/dL (ref 8.6–10.3)
CO2: 28 mmol/L (ref 20–32)
Chloride: 106 mmol/L (ref 98–110)
Creat: 0.92 mg/dL (ref 0.60–1.35)
GFR, EST AFRICAN AMERICAN: 119 mL/min/{1.73_m2} (ref >=60)
GFR, EST NON AFRICAN AMERICAN: 103 mL/min/{1.73_m2} (ref >=60)
GLOBULIN: 2.3 g/dL (ref 1.9–3.7)
Glucose, Bld: 104 mg/dL — ABNORMAL HIGH (ref 65–99)
Potassium: 4.5 mmol/L (ref 3.5–5.3)
SODIUM: 140 mmol/L (ref 135–146)
Total Bilirubin: 0.7 mg/dL (ref 0.2–1.2)
Total Protein: 6.7 g/dL (ref 6.1–8.1)

## 2017-03-04 LAB — HEMOGLOBIN A1C W/OUT EAG: HEMOGLOBIN A1C: 5.2 %{Hb} (ref <5.7)

## 2017-03-04 LAB — LIPID PANEL W/REFLEX DIRECT LDL
CHOL/HDL RATIO: 6.3 (calc) — AB (ref <5.0)
CHOLESTEROL: 241 mg/dL — AB (ref <200)
HDL: 38 mg/dL — AB (ref >40)
LDL Cholesterol (Calc): 172 mg/dL (calc) — ABNORMAL HIGH
Non-HDL Cholesterol (Calc): 203 mg/dL (calc) — ABNORMAL HIGH (ref <130)
Triglycerides: 163 mg/dL — ABNORMAL HIGH (ref <150)

## 2017-03-04 LAB — CBC
HEMATOCRIT: 48.6 % (ref 38.5–50.0)
Hemoglobin: 16.8 g/dL (ref 13.2–17.1)
MCH: 30.8 pg (ref 27.0–33.0)
MCHC: 34.6 g/dL (ref 32.0–36.0)
MCV: 89.2 fL (ref 80.0–100.0)
MPV: 9.7 fL (ref 7.5–12.5)
Platelets: 336 10*3/uL (ref 140–400)
RBC: 5.45 10*6/uL (ref 4.20–5.80)
RDW: 12.7 % (ref 11.0–15.0)
WBC: 9.8 10*3/uL (ref 3.8–10.8)

## 2017-03-04 LAB — PSA: PSA: 0.6 ng/mL (ref <=4.0)

## 2017-03-23 ENCOUNTER — Encounter: Payer: Self-pay | Admitting: Physician Assistant

## 2017-04-06 ENCOUNTER — Ambulatory Visit: Payer: BLUE CROSS/BLUE SHIELD | Admitting: Sports Medicine

## 2017-04-06 ENCOUNTER — Encounter: Payer: Self-pay | Admitting: Sports Medicine

## 2017-04-06 DIAGNOSIS — M961 Postlaminectomy syndrome, not elsewhere classified: Secondary | ICD-10-CM | POA: Diagnosis not present

## 2017-04-06 MED ORDER — PREDNISONE 50 MG PO TABS: ORAL_TABLET | ORAL | 0 refills | Status: DC

## 2017-04-06 MED ORDER — GABAPENTIN 300 MG PO CAPS: ORAL_CAPSULE | ORAL | 3 refills | Status: DC
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
## 2017-04-06 NOTE — Assessment & Plan Note (Signed)
Status post work injury with multiple disc herniations, initial laminectomy did not provide much relief, subsequently there was an L3-L5 posterior fusion. Persistence of left leg radiculopathy, numbness to the middle 3 toes, weakness. I do suspect there is some permanent L5 neuropathy. Adding prednisone burst to get him some rapid relief, gabapentin and a slow up taper, we do need him to drop off his most recent MRI disks. Also adding a nerve conduction study of the l left and right lower extremities. Return to see me after nerve conduction study. I did explain to him that realistically we may not get any of his nerve function back.

## 2017-04-22 ENCOUNTER — Encounter: Payer: Self-pay | Admitting: Neurology

## 2017-04-22 ENCOUNTER — Telehealth: Payer: Self-pay

## 2017-04-22 ENCOUNTER — Encounter (INDEPENDENT_AMBULATORY_CARE_PROVIDER_SITE_OTHER): Payer: BLUE CROSS/BLUE SHIELD | Admitting: Neurology

## 2017-04-22 DIAGNOSIS — Z0289 Encounter for other administrative examinations: Secondary | ICD-10-CM

## 2017-04-22 NOTE — Telephone Encounter (Signed)
I attempted to perform a Nerve Conduction Study on Mr. Andrew Bean today.  I stimulated the Sural sensory nerve and he had the most violent reaction I have ever experienced as a tech.  He yelled, cussed, thrashed about and rolled from side to side on the stretcher. He said, "What have you done to my back, that hurt my back, I felt that all the way up to my back, what have you done to me?"  I informed him that the small signal I applied to his distal sensory nerve did not travel up to his back.  He asked me, "Why does my back hurt then?"  I offered that perhaps his thrashing about somehow hurt his back or pulled a muscle, but I really wasn't sure as I am not a doctor.  I did reassure him that the test could not be causing his symptoms.  He then said his hips hurt and he felt cold inside.  I stopped the test, helped the patient get dressed and let him go home.

## 2017-05-10 ENCOUNTER — Ambulatory Visit: Payer: Self-pay | Admitting: Physician Assistant

## 2017-05-12 ENCOUNTER — Ambulatory Visit: Payer: Self-pay | Admitting: Physician Assistant

## 2017-05-12 DIAGNOSIS — Z0189 Encounter for other specified special examinations: Secondary | ICD-10-CM

## 2017-06-18 ENCOUNTER — Other Ambulatory Visit: Payer: Self-pay | Admitting: Internal Medicine

## 2017-11-02 ENCOUNTER — Other Ambulatory Visit: Payer: Self-pay | Admitting: Sports Medicine

## 2017-11-02 ENCOUNTER — Ambulatory Visit (INDEPENDENT_AMBULATORY_CARE_PROVIDER_SITE_OTHER): Payer: BLUE CROSS/BLUE SHIELD | Admitting: Physician Assistant

## 2017-11-02 ENCOUNTER — Ambulatory Visit (INDEPENDENT_AMBULATORY_CARE_PROVIDER_SITE_OTHER): Payer: BLUE CROSS/BLUE SHIELD

## 2017-11-02 ENCOUNTER — Encounter: Payer: Self-pay | Admitting: Sports Medicine

## 2017-11-02 ENCOUNTER — Encounter: Payer: Self-pay | Admitting: Physician Assistant

## 2017-11-02 ENCOUNTER — Ambulatory Visit (INDEPENDENT_AMBULATORY_CARE_PROVIDER_SITE_OTHER): Payer: BLUE CROSS/BLUE SHIELD | Admitting: Sports Medicine

## 2017-11-02 VITALS — BP 152/82 | HR 87

## 2017-11-02 VITALS — BP 144/94 | HR 78 | Ht 73.0 in | Wt 190.0 lb

## 2017-11-02 DIAGNOSIS — F322 Major depressive disorder, single episode, severe without psychotic features: Secondary | ICD-10-CM

## 2017-11-02 DIAGNOSIS — E785 Hyperlipidemia, unspecified: Secondary | ICD-10-CM

## 2017-11-02 DIAGNOSIS — M961 Postlaminectomy syndrome, not elsewhere classified: Secondary | ICD-10-CM | POA: Diagnosis not present

## 2017-11-02 DIAGNOSIS — Z23 Encounter for immunization: Secondary | ICD-10-CM | POA: Diagnosis not present

## 2017-11-02 DIAGNOSIS — G894 Chronic pain syndrome: Secondary | ICD-10-CM | POA: Diagnosis not present

## 2017-11-02 DIAGNOSIS — I10 Essential (primary) hypertension: Secondary | ICD-10-CM | POA: Diagnosis not present

## 2017-11-02 DIAGNOSIS — N50811 Right testicular pain: Secondary | ICD-10-CM

## 2017-11-02 LAB — LIPID PANEL W/REFLEX DIRECT LDL
CHOL/HDL RATIO: 6.1 (calc) — AB (ref <5.0)
Cholesterol: 251 mg/dL — ABNORMAL HIGH (ref <200)
HDL: 41 mg/dL (ref >40)
LDL CHOLESTEROL (CALC): 183 mg/dL — AB
NON-HDL CHOLESTEROL (CALC): 210 mg/dL — AB (ref <130)
TRIGLYCERIDES: 134 mg/dL (ref <150)

## 2017-11-02 MED ORDER — PREGABALIN 75 MG PO CAPS
75.0000 mg | ORAL_CAPSULE | Freq: Two times a day (BID) | ORAL | 1 refills | Status: DC
Start: 2017-11-02 — End: 2017-12-28

## 2017-11-02 MED ORDER — DULOXETINE HCL 30 MG PO CPEP: 30.0000 mg | ORAL_CAPSULE | Freq: Two times a day (BID) | ORAL | 1 refills | Status: DC

## 2017-11-02 NOTE — Progress Notes (Signed)
Subjective:    Patient ID: Andrew Bean, male    DOB: [birth year], 42 y.o.   MRN: [id number]  HPI  Pt is a 43 yo male who presents to the clinic with hx of lumbar post laminectomy syndrome, chronic low back pain, HTN, problems sleeping, HLD who presents to the clinic for follow up.   He is currently on long term disability and filing for SS disability. He is in constant pain. Pt reports no quality of life. He is very down. He is very irritable. He gets mad easily. He states "I would kill myself if I knew it wouldn't destroy my kids". He admits he has not been very complaint with medications in the past. He reports he was scared about dementia side effect of gabapentin. He does not want to go to pain clinic "to be doped up". He has a lot of problems sleeping due to the pins and needles in his left leg. He did try requip and did not help.   Cholesterol was elevated in [place]. He has stopped eating as much fried food and red meat and would liked to have rechecked.   .. Active Ambulatory Problems    Diagnosis Date Noted  . Elevated blood pressure 09/03/2015  . Tobacco dependence 09/03/2015  . Lumbar post-laminectomy syndrome 09/03/2015  . Insomnia 09/03/2015  . Restless leg syndrome, uncontrolled 09/03/2015  . Essential hypertension, benign 09/17/2015  . Depression, major, single episode, moderate (HCC) 02/14/2017  . Dyslipidemia 03/04/2017   Resolved Ambulatory Problems    Diagnosis Date Noted  . Radiculopathy of lumbar region 08/19/2010  . Spinal stenosis at L4-L5 level 05/24/2014  . Degenerative disc disease, lumbar 01/10/2015  . Cramp of both lower extremities 09/03/2015   Past Medical History:  Diagnosis Date  . Headache   . Hyperlipemia   . Hypertension       Review of Systems  All other systems reviewed and are negative.      Objective:   Physical Exam  Constitutional: He is oriented to person, place, and time. He appears well-developed and well-nourished.  HENT:   Head: Normocephalic and atraumatic.  Cardiovascular: Normal rate and regular rhythm.  Pulmonary/Chest: Effort normal and breath sounds normal.  Musculoskeletal:  Pain with getting up out of a chair. Decreased ROM due to pain.   Neurological: He is alert and oriented to person, place, and time.  Psychiatric:  Flat affect.           Assessment & Plan:  Marland KitchenMarland KitchenDiagnoses and all orders for this visit:  Current severe episode of major depressive disorder without psychotic features without prior episode (HCC) -     DULoxetine (CYMBALTA) 30 MG capsule; Take 1 capsule (30 mg total) by mouth 2 (two) times daily.  Chronic pain syndrome -     DULoxetine (CYMBALTA) 30 MG capsule; Take 1 capsule (30 mg total) by mouth 2 (two) times daily. -     pregabalin (LYRICA) 75 MG capsule; Take 1 capsule (75 mg total) by mouth 2 (two) times daily.  Dyslipidemia -     Lipid Panel w/reflex Direct LDL  Essential hypertension, benign  Need for immunization against influenza -     Flu Vaccine QUAD 36+ mos IM   .Marland KitchenMarland Kitchen Depression screen [facility] 2/9 11/02/2017 02/08/2017 09/03/2015  Decreased Interest 2 0 0  Down, Depressed, Hopeless 3 3 1   PHQ - 2 Score 5 3 1   Altered sleeping 3 3 2   Tired, decreased energy 3 3 0  Change in appetite  1 2 0  Feeling bad or failure about yourself  3 2 0  Trouble concentrating 3 2 0  Moving slowly or fidgety/restless 1 0 0  Suicidal thoughts 0 1 0  PHQ-9 Score 19 16 3   Difficult doing work/chores Very difficult - -   .Marland Kitchen GAD 7 : Generalized Anxiety Score 11/02/2017 09/03/2015  Nervous, Anxious, on Edge 3 1  Control/stop worrying 3 3  Worry too much - different things 3 1  Trouble relaxing 3 2  Restless 2 2  Easily annoyed or irritable 3 2  Afraid - awful might happen 3 1  Total GAD 7 Score 20 12  Anxiety Difficulty Very difficult Not difficult at all    Pt is certainly depressed which is not helping his chronic pain. Today started cymbalta and lyrica. Discussed he had to give  these medications time to work 4-6 weeks and then titrate up to the most effective dose. Pt is in agreement.
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
## 2017-11-02 NOTE — Patient Instructions (Signed)
Start cymbalta and lyrica.  Recheck lipid.

## 2017-11-02 NOTE — Assessment & Plan Note (Signed)
Post work injury with multiple disc herniations, he is post L3-L5 fusion. Has partial disability with Dr. Yevette Edwards, discharged from him. Has not had advanced imaging since his lumbar fusion, adding an MRI with and without contrast. He has persistent leg and foot pain, as well as a new pain in the right testicle. Adding a scrotal ultrasound. He did see Tandy Gaw, PA-C, she added Lyrica 75 and Cymbalta 30, this is appropriate in exactly what I would have done.   He was unable to tolerate his nerve conduction study. Again we discussed expectations with regards to his back pain.

## 2017-11-03 ENCOUNTER — Other Ambulatory Visit: Payer: Self-pay | Admitting: Sports Medicine

## 2017-11-03 DIAGNOSIS — I10 Essential (primary) hypertension: Secondary | ICD-10-CM

## 2017-11-03 NOTE — Progress Notes (Signed)
Lab ordered per radiology protocol.  

## 2017-11-03 NOTE — Progress Notes (Signed)
Call pt: HDL good cholesterol did improve but LDL worsened as well. I do recommend starting a cholesterol lowering medication. What are your thoughts?

## 2017-11-04 MED ORDER — ATORVASTATIN CALCIUM 40 MG PO TABS: 40.0000 mg | ORAL_TABLET | Freq: Every day | ORAL | 3 refills | Status: DC

## 2017-11-04 NOTE — Addendum Note (Signed)
Addended by: Jomarie Longs on: 11/04/2017 10:10 PM   Modules accepted: Orders

## 2017-11-04 NOTE — Progress Notes (Signed)
Sent to pharmacy 

## 2017-11-10 DIAGNOSIS — I10 Essential (primary) hypertension: Secondary | ICD-10-CM | POA: Diagnosis not present

## 2017-11-10 LAB — COMPREHENSIVE METABOLIC PANEL WITH GFR
AG Ratio: 2.1 (calc) (ref 1.0–2.5)
Creat: 0.94 mg/dL (ref 0.60–1.35)
Globulin: 2 g/dL (ref 1.9–3.7)
Glucose, Bld: 115 mg/dL — ABNORMAL HIGH (ref 65–99)
Sodium: 140 mmol/L (ref 135–146)
Total Bilirubin: 0.5 mg/dL (ref 0.2–1.2)
Total Protein: 6.2 g/dL (ref 6.1–8.1)

## 2017-11-10 LAB — COMPREHENSIVE METABOLIC PANEL
ALT: 33 U/L (ref 9–46)
AST: 25 U/L (ref 10–40)
Albumin: 4.2 g/dL (ref 3.6–5.1)
Alkaline phosphatase (APISO): 71 U/L (ref 40–115)
BUN: 9 mg/dL (ref 7–25)
CO2: 29 mmol/L (ref 20–32)
Calcium: 9 mg/dL (ref 8.6–10.3)
Chloride: 104 mmol/L (ref 98–110)
Potassium: 4.3 mmol/L (ref 3.5–5.3)

## 2017-11-22 ENCOUNTER — Telehealth: Payer: Self-pay

## 2017-11-22 ENCOUNTER — Ambulatory Visit (INDEPENDENT_AMBULATORY_CARE_PROVIDER_SITE_OTHER): Payer: BLUE CROSS/BLUE SHIELD

## 2017-11-22 DIAGNOSIS — M5126 Other intervertebral disc displacement, lumbar region: Secondary | ICD-10-CM | POA: Diagnosis not present

## 2017-11-22 DIAGNOSIS — M961 Postlaminectomy syndrome, not elsewhere classified: Secondary | ICD-10-CM

## 2017-11-22 MED ORDER — GADOBUTROL 1 MMOL/ML IV SOLN
9.0000 mL | Freq: Once | INTRAVENOUS | Status: AC | PRN
  Administered 2017-11-22: 9 mL via INTRAVENOUS

## 2017-11-22 NOTE — Telephone Encounter (Signed)
The muscle relaxer he is taking in the evening is not wearing off. It's making him like a zombie during the day. He would like to take some thing different so he isn't always tired the next day.   After reviewing patients medications with him, the one he said he is taking for pain one time at bedtime is not listed. He said he will go home and call back to let us know which medicine it is.   Please call pt to advise if there is something different he can take.

## 2017-11-22 NOTE — Telephone Encounter (Signed)
Pt called back stating that medication he is referring to is his Amitriptyline, last given 02-08-17 but has been removed from med list

## 2017-11-22 NOTE — Telephone Encounter (Signed)
Try decreasing by half and see if helps but makes less sleepy. That would be 25mg  at bedtime.

## 2017-11-23 MED ORDER — ORPHENADRINE CITRATE ER 100 MG PO TB12: 100.0000 mg | ORAL_TABLET | Freq: Two times a day (BID) | ORAL | 1 refills | Status: DC

## 2017-11-23 MED ORDER — AMITRIPTYLINE HCL 25 MG PO TABS: 25.0000 mg | ORAL_TABLET | Freq: Every day | ORAL | 0 refills | Status: DC

## 2017-11-23 NOTE — Telephone Encounter (Signed)
Patient advised.

## 2017-11-23 NOTE — Telephone Encounter (Signed)
Called pt, he is not able to cut pill in half due to it being too small. Wondering if maybe he can have a lesser dose called in?   Also wanting to know if he can have a muscle relaxer that he can take during the day that will not make him drowsy and will provide some relief

## 2017-11-23 NOTE — Telephone Encounter (Signed)
Sent norflex and 25mg  elavil.

## 2017-11-23 NOTE — Addendum Note (Signed)
Addended byTandy Gaw L on: 11/23/2017 01:00 PM   Modules accepted: Orders

## 2017-11-30 ENCOUNTER — Encounter: Payer: Self-pay | Admitting: Sports Medicine

## 2017-11-30 ENCOUNTER — Ambulatory Visit: Payer: BLUE CROSS/BLUE SHIELD | Admitting: Sports Medicine

## 2017-11-30 DIAGNOSIS — M961 Postlaminectomy syndrome, not elsewhere classified: Secondary | ICD-10-CM | POA: Diagnosis not present

## 2017-11-30 DIAGNOSIS — F321 Major depressive disorder, single episode, moderate: Secondary | ICD-10-CM | POA: Diagnosis not present

## 2017-11-30 MED ORDER — VORTIOXETINE HBR 10 MG PO TABS: 10.0000 mg | ORAL_TABLET | Freq: Every day | ORAL | 3 refills | Status: DC

## 2017-11-30 NOTE — Assessment & Plan Note (Signed)
Possibly a little better with Cymbalta but having some anorgasmia so switching to Trintellix. Ultimately we may need to discontinue his Elavil and/or Lyrica if persistent sexual dysfunction.
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
## 2017-11-30 NOTE — Assessment & Plan Note (Signed)
L3-L5 posterior fusion looks good. He does have progressive degenerative disc disease at L5-S1, left-sided protrusion. Possibly a little better with Cymbalta but having some anorgasmia so switching to Trintellix. Unable to tolerate nerve conduction study. Continue Lyrica 75 for now, we may bump up to 100 to 150 mg at a future visit. He would be a candidate for a left L5-S1 interlaminar epidural but would like to avoid injections for now.

## 2017-12-06 ENCOUNTER — Telehealth: Payer: Self-pay | Admitting: Sports Medicine

## 2017-12-06 NOTE — Telephone Encounter (Signed)
Received fax from [employer] that Trintellix requires a PA. Information has been sent to the insurance company. Awaiting determination.   

## 2017-12-23 NOTE — Telephone Encounter (Signed)
Approved (Trintillix) Effective from 12/06/2017 through 12/04/2020.Pharmacy aware.

## 2017-12-28 ENCOUNTER — Ambulatory Visit (INDEPENDENT_AMBULATORY_CARE_PROVIDER_SITE_OTHER): Payer: BLUE CROSS/BLUE SHIELD | Admitting: Sports Medicine

## 2017-12-28 ENCOUNTER — Encounter: Payer: Self-pay | Admitting: Sports Medicine

## 2017-12-28 DIAGNOSIS — M961 Postlaminectomy syndrome, not elsewhere classified: Secondary | ICD-10-CM

## 2017-12-28 MED ORDER — PREGABALIN 100 MG PO CAPS: 100.0000 mg | ORAL_CAPSULE | Freq: Two times a day (BID) | ORAL | 3 refills | Status: DC

## 2017-12-28 MED ORDER — PREDNISONE 50 MG PO TABS: ORAL_TABLET | ORAL | 0 refills | Status: DC

## 2017-12-28 NOTE — Assessment & Plan Note (Signed)
Progressive DDD at L5-S1 with a left-sided protrusion. He is having left-sided radiculopathy but continues to decline epidurals. L3-L5 posterior fusion looks good. He had anorgasmia with Cymbalta so we switched it to Trintellix, this was not approved immediately so he went back to Cymbalta, on 21 November we got approval. He is going to stop Cymbalta and start Trintellix, increasing Lyrica to 100 mg twice daily with a steady up taper as needed. Adding 5 days of prednisone to get him through Thanksgiving, he is a candidate for a left L5-S1 interlaminar epidural but continues to want to avoid injections for now.
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
## 2018-01-03 ENCOUNTER — Ambulatory Visit: Payer: BLUE CROSS/BLUE SHIELD | Admitting: Sports Medicine

## 2018-01-03 ENCOUNTER — Telehealth: Payer: Self-pay

## 2018-01-03 DIAGNOSIS — M961 Postlaminectomy syndrome, not elsewhere classified: Secondary | ICD-10-CM

## 2018-01-03 NOTE — Telephone Encounter (Signed)
Andrew SorrowJerry changed his mind. He would like to go ahead with the epidural.

## 2018-01-03 NOTE — Telephone Encounter (Signed)
Left L5-S1 interlaminar epidural ordered, please contact [place] [facility] for scheduling.

## 2018-01-03 NOTE — Telephone Encounter (Signed)
Left message with [facility].

## 2018-01-10 ENCOUNTER — Ambulatory Visit
Admission: RE | Admit: 2018-01-10 | Discharge: 2018-01-10 | Disposition: A | Discharge status: Home / Self Care | Payer: BLUE CROSS/BLUE SHIELD | Source: Ambulatory Visit | Attending: Sports Medicine | Admitting: Sports Medicine

## 2018-01-10 DIAGNOSIS — M545 Low back pain: Secondary | ICD-10-CM | POA: Diagnosis not present

## 2018-01-10 MED ORDER — IOPAMIDOL (ISOVUE-M 200) INJECTION 41%
1.0000 mL | Freq: Once | INTRAMUSCULAR | Status: AC
  Administered 2018-01-10: 1 mL via EPIDURAL

## 2018-01-10 MED ORDER — METHYLPREDNISOLONE ACETATE 40 MG/ML INJ SUSP (RADIOLOG
120.0000 mg | Freq: Once | INTRAMUSCULAR | Status: AC
  Administered 2018-01-10: 120 mg via EPIDURAL

## 2018-01-10 NOTE — Discharge Instructions (Signed)

## 2018-01-18 ENCOUNTER — Telehealth: Payer: Self-pay | Admitting: *Deleted

## 2018-01-18 DIAGNOSIS — M961 Postlaminectomy syndrome, not elsewhere classified: Secondary | ICD-10-CM

## 2018-01-18 NOTE — Telephone Encounter (Signed)
Andrew Bean notified of order.

## 2018-01-18 NOTE — Telephone Encounter (Signed)
Pt left vm stating that he had an epidural last Monday and they told him to get a new order from you for them to place another one in a different location.

## 2018-01-18 NOTE — Telephone Encounter (Signed)
I am unsure as to what they mean by placing another shot in a different location.  Are they referring to targeting his facet joints?  Ultimately I do not really know what they mean, but his L5-S1 facets could be an additional pain generator.

## 2018-01-27 ENCOUNTER — Encounter: Payer: Self-pay | Admitting: Sports Medicine

## 2018-01-27 ENCOUNTER — Ambulatory Visit (INDEPENDENT_AMBULATORY_CARE_PROVIDER_SITE_OTHER): Payer: BLUE CROSS/BLUE SHIELD | Admitting: Sports Medicine

## 2018-01-27 DIAGNOSIS — E785 Hyperlipidemia, unspecified: Secondary | ICD-10-CM

## 2018-01-27 DIAGNOSIS — M961 Postlaminectomy syndrome, not elsewhere classified: Secondary | ICD-10-CM

## 2018-01-27 DIAGNOSIS — F321 Major depressive disorder, single episode, moderate: Secondary | ICD-10-CM

## 2018-01-27 MED ORDER — PREGABALIN 150 MG PO CAPS: 150.0000 mg | ORAL_CAPSULE | Freq: Two times a day (BID) | ORAL | 3 refills | Status: DC

## 2018-01-27 MED ORDER — METHOCARBAMOL 500 MG PO TABS: 500.0000 mg | ORAL_TABLET | Freq: Three times a day (TID) | ORAL | 0 refills | Status: DC

## 2018-01-27 MED ORDER — VORTIOXETINE HBR 20 MG PO TABS: 20.0000 mg | ORAL_TABLET | Freq: Every day | ORAL | 11 refills | Status: DC
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
## 2018-01-27 NOTE — Assessment & Plan Note (Signed)
Some of his pain may be coming from his Lipitor, discontinue this, he will discuss Livalo with his PCP.

## 2018-01-27 NOTE — Assessment & Plan Note (Signed)
Increasing Trintellix. Excessive daytime drowsiness with low-dose amitriptyline, discontinue this.

## 2018-01-27 NOTE — Assessment & Plan Note (Signed)
Progressive DDD at L5-S1 with a left-sided protrusion. Epidural on the left at L5-S1 did not provide any relief. Proceeding with an L5-S1 bilateral facet injection. If this does not work we will try SI joint injections here in the office. Overall doing well with Trintellix, increasing to 20 mg. Increasing Lyrica to 150 twice daily. Discontinue amitriptyline and Norflex, switching to Robaxin. Some of his pain may be coming from his Lipitor, discontinue this, he will discuss Livalo with his PCP. If failure of facet injections and SI joint injections we will need to discuss referral to medical pain management as well.

## 2018-01-28 ENCOUNTER — Ambulatory Visit
Admission: RE | Admit: 2018-01-28 | Discharge: 2018-01-28 | Disposition: A | Discharge status: Home / Self Care | Payer: BLUE CROSS/BLUE SHIELD | Source: Ambulatory Visit | Attending: Sports Medicine | Admitting: Sports Medicine

## 2018-01-28 DIAGNOSIS — M47817 Spondylosis without myelopathy or radiculopathy, lumbosacral region: Secondary | ICD-10-CM | POA: Diagnosis not present

## 2018-01-28 MED ORDER — IOPAMIDOL (ISOVUE-M 200) INJECTION 41%
1.0000 mL | Freq: Once | INTRAMUSCULAR | Status: AC
  Administered 2018-01-28: 1 mL via INTRA_ARTICULAR

## 2018-01-28 MED ORDER — METHYLPREDNISOLONE ACETATE 40 MG/ML INJ SUSP (RADIOLOG
120.0000 mg | Freq: Once | INTRAMUSCULAR | Status: AC
  Administered 2018-01-28: 120 mg via INTRA_ARTICULAR

## 2018-02-19 ENCOUNTER — Emergency Department
Admission: EM | Admit: 2018-02-19 | Discharge: 2018-02-19 | Disposition: A | Discharge status: Home / Self Care | Payer: BLUE CROSS/BLUE SHIELD | Source: Home / Self Care | Attending: Family Medicine | Admitting: Family Medicine

## 2018-02-19 ENCOUNTER — Other Ambulatory Visit: Payer: Self-pay

## 2018-02-19 DIAGNOSIS — M26622 Arthralgia of left temporomandibular joint: Secondary | ICD-10-CM

## 2018-02-19 LAB — POCT CBC W AUTO DIFF (K'VILLE URGENT CARE)

## 2018-02-19 MED ORDER — PREDNISONE 20 MG PO TABS: ORAL_TABLET | ORAL | 0 refills | Status: DC

## 2018-02-19 MED ORDER — HYDROCODONE-ACETAMINOPHEN 5-325 MG PO TABS: ORAL_TABLET | ORAL | 0 refills | Status: DC

## 2018-02-19 MED ORDER — AMOXICILLIN 875 MG PO TABS: 875.0000 mg | ORAL_TABLET | Freq: Two times a day (BID) | ORAL | 0 refills | Status: DC
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
## 2018-02-19 NOTE — Discharge Instructions (Signed)
Apply ice pack for 10 to 15 minutes, 3 to 4 times daily  Continue until pain and swelling decrease.  May take Tylenol daytime as needed for pain.

## 2018-02-19 NOTE — ED Triage Notes (Signed)
Pt c/o LEFT ear pain x 2 days. Denies any other sxs. Says it feels like "someone is shoving an ice pick in his ear". Taking ibuprofen and sinus med prn. Pain 8/10

## 2018-02-28 ENCOUNTER — Ambulatory Visit (INDEPENDENT_AMBULATORY_CARE_PROVIDER_SITE_OTHER): Payer: BLUE CROSS/BLUE SHIELD | Admitting: Physician Assistant

## 2018-02-28 ENCOUNTER — Ambulatory Visit (INDEPENDENT_AMBULATORY_CARE_PROVIDER_SITE_OTHER): Payer: BLUE CROSS/BLUE SHIELD | Admitting: Sports Medicine

## 2018-02-28 ENCOUNTER — Encounter: Payer: Self-pay | Admitting: Physician Assistant

## 2018-02-28 VITALS — BP 149/95 | HR 93 | Temp 97.6°F | Wt 209.0 lb

## 2018-02-28 DIAGNOSIS — I10 Essential (primary) hypertension: Secondary | ICD-10-CM

## 2018-02-28 DIAGNOSIS — E785 Hyperlipidemia, unspecified: Secondary | ICD-10-CM | POA: Diagnosis not present

## 2018-02-28 DIAGNOSIS — M7752 Other enthesopathy of left foot: Secondary | ICD-10-CM | POA: Insufficient documentation

## 2018-02-28 DIAGNOSIS — M961 Postlaminectomy syndrome, not elsewhere classified: Secondary | ICD-10-CM | POA: Diagnosis not present

## 2018-02-28 DIAGNOSIS — F321 Major depressive disorder, single episode, moderate: Secondary | ICD-10-CM | POA: Diagnosis not present

## 2018-02-28 DIAGNOSIS — G894 Chronic pain syndrome: Secondary | ICD-10-CM

## 2018-02-28 DIAGNOSIS — G2581 Restless legs syndrome: Secondary | ICD-10-CM | POA: Diagnosis not present

## 2018-02-28 MED ORDER — BACLOFEN 10 MG PO TABS: 10.0000 mg | ORAL_TABLET | Freq: Three times a day (TID) | ORAL | 0 refills | Status: DC

## 2018-02-28 MED ORDER — LISINOPRIL 10 MG PO TABS: 10.0000 mg | ORAL_TABLET | Freq: Every day | ORAL | 2 refills | Status: DC

## 2018-02-28 MED ORDER — BUPROPION HCL ER (XL) 150 MG PO TB24: 150.0000 mg | ORAL_TABLET | Freq: Every day | ORAL | 3 refills | Status: DC

## 2018-02-28 MED ORDER — PITAVASTATIN CALCIUM 2 MG PO TABS: 1.0000 | ORAL_TABLET | Freq: Every day | ORAL | 11 refills | Status: DC

## 2018-02-28 NOTE — Progress Notes (Signed)
Subjective:    Patient ID: Andrew Bean, male    DOB: 09/08/1975, 43 y.o.   MRN: [id number]  HPI  Patient is a 43 year old male with hypertension, depression, dyslipidemia, restless leg syndrome, and chronic pain.  He presents to the clinic for medication refill and follow-up.  He was last seen by Dr. Karie Schwalbe for his post laminectomy syndrome and pain.  Dr. Karie Schwalbe suspected there could be some increased myalgias from his Lipitor.  He stopped his Lipitor on 01/28/2019.  He increase his Lyrica to 150 twice a day and his Trintellix to 20 mg daily.  He does report that his myalgias did get some better after stopping Lipitor.  Overall he is still in a lot of chronic pain.  He does not like the idea of going to a pain clinic because last time although he did which is give him pain medicine that made him really sleepy and his blood pressures go up and down.  He was previously scheduled for EMGs.  He was not able to complete the test due to the pain with test.  .. Active Ambulatory Problems    Diagnosis Date Noted  . Elevated blood pressure 09/03/2015  . Tobacco dependence 09/03/2015  . Lumbar post-laminectomy syndrome 09/03/2015  . Insomnia 09/03/2015  . Restless leg syndrome, uncontrolled 09/03/2015  . Essential hypertension, benign 09/17/2015  . Depression, major, single episode, moderate (HCC) 02/14/2017  . Dyslipidemia 03/04/2017  . Bursitis of intermetatarsal bursa of left foot 02/28/2018  . Chronic pain syndrome 03/02/2018   Resolved Ambulatory Problems    Diagnosis Date Noted  . Radiculopathy of lumbar region 08/19/2010  . Spinal stenosis at L4-L5 level 05/24/2014  . Degenerative disc disease, lumbar 01/10/2015  . Cramp of both lower extremities 09/03/2015   Past Medical History:  Diagnosis Date  . Headache   . Hyperlipemia   . Hypertension      Review of Systems    see HPI.  Objective:   Physical Exam Vitals signs reviewed.  Constitutional:      Appearance: Normal appearance.   HENT:     Head: Normocephalic and atraumatic.  Cardiovascular:     Rate and Rhythm: Normal rate and regular rhythm.     Pulses: Normal pulses.  Pulmonary:     Effort: Pulmonary effort is normal.     Breath sounds: Normal breath sounds.  Neurological:     General: No focal deficit present.     Mental Status: He is alert and oriented to person, place, and time.  Psychiatric:     Comments: Flat affect.            Assessment & Plan:  .Marland Kitchen.Dorene SorrowJerry was seen today for follow-up.  Diagnoses and all orders for this visit:  Essential hypertension, benign -     lisinopril (PRINIVIL,ZESTRIL) 10 MG tablet; Take 1 tablet (10 mg total) by mouth daily.  Depression, major, single episode, moderate (HCC)  Dyslipidemia -     Pitavastatin Calcium 2 MG TABS; Take 1 tablet (2 mg total) by mouth daily.  Restless leg syndrome, uncontrolled -     baclofen (LIORESAL) 10 MG tablet; Take 1 tablet (10 mg total) by mouth 3 (three) times daily.  Lumbar post-laminectomy syndrome -     baclofen (LIORESAL) 10 MG tablet; Take 1 tablet (10 mg total) by mouth 3 (three) times daily. -     Ambulatory referral to Pain Clinic  Chronic pain syndrome -     Ambulatory referral to Pain Clinic   ..Marland Kitchen  Depression screen [facility] 2/9 02/28/2018 11/02/2017 02/08/2017 09/03/2015  Decreased Interest 3 2 0 0  Down, Depressed, Hopeless 2 3 3 1   PHQ - 2 Score 5 5 3 1   Altered sleeping 3 3 3 2   Tired, decreased energy 3 3 3  0  Change in appetite 2 1 2  0  Feeling bad or failure about yourself  2 3 2  0  Trouble concentrating 2 3 2  0  Moving slowly or fidgety/restless 0 1 0 0  Suicidal thoughts 0 0 1 0  PHQ-9 Score 17 19 16 3   Difficult doing work/chores Very difficult Very difficult - -   Patient does need to be on some type of cholesterol reducing agent.  Will start Livalo.  Follow-up with any concerns.  Blood pressure is out of control today.  Will start lisinopril.  Follow-up in 2 to 4 weeks for blood pressure recheck and  CMP.  Patient continues to have chronic pain.  He is not on a muscle relaxer.  Added baclofen to take at least once a day at night before bed.  We will see if this helps with pain any.  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  I think it would be advantageous for him to see a pain clinic.  The goal would be to decrease some of his pain so he can live and function with some pain relief.

## 2018-02-28 NOTE — Assessment & Plan Note (Signed)
Progressive DDD at L5-S1 with a left-sided protrusion. Left-sided L5-S1 epidural did not provide any relief. Bilateral L5-S1 facet injections provided good relief albeit temporary. I am going to set him up for facet radiofrequency ablation at these levels, he does tell me he will probably has some difficulty paying for it. If he is unable to afford RFA we will proceed with medical pain management. Continue Lyrica 150 twice a day. Continue Trintellix 20, adding Wellbutrin because his depression is insufficiently controlled and this will hijack our treatment of his back pain. At the last visit we discontinued amitriptyline and Norflex and switch to Robaxin which is doing well.

## 2018-02-28 NOTE — Patient Instructions (Signed)
Facet Syndrome  Facet syndrome is a condition in which joints (facet joints) that connect the bones of the spine (vertebrae) become damaged. Facet joints help the spine move, and they usually wear down (degenerate) or become inflamed as you age. This can cause pain and stiffness in the neck (cervical facet syndrome) or in the lower back (lumbar facet syndrome). When a facet joint becomes damaged, a vertebra may slip forward, out of its normal place in the spine. Damage to a facet joint can also damage nerves near the spine, which can cause tingling or weakness in the arms or legs. Facet syndrome can make it difficult to turn the head or bend backward without pain. This condition typically gets worse over time. What are the causes? Common causes of this condition include:  Age-related inflammation of the facet joints (arthritis) that may create extra bone on the joint surface (bone spurs).  Age-related decrease in space between the vertebrae (disk degeneration and cartilage degeneration).  Repetitive stress on the spine, such as repetitive twisting of the back.  Injury (trauma) to the back or neck. What increases the risk? The following factors may make you more likely to develop this condition:  Playing contact sports.  Doing activities or sports that involve repetitive twisting motions or repetitive heavy lifting.  Having poor back strength and flexibility.  Having another back or spine condition, such as scoliosis. What are the signs or symptoms? Symptoms of facet syndrome may include:  An ache in the neck or lower back. This may get worse when you twist or arch your back, or when you look up.  Stiffness in the neck or lower back.  Numbness, tingling, or weakness in the arms or legs. Symptoms of cervical facet syndrome may include:  Headache.  Pain at the back of the head.  Pain in the shoulder blades. Symptoms of lumbar facet syndrome may include pain in any of the  following areas:  Groin.  Thighs.  Lower back.  Buttocks.  Hips. How is this diagnosed? This condition may be diagnosed based on:  Your symptoms.  Your medical history.  A physical exam.  Imaging tests, such as: ? X-rays. ? MRI.  A procedure in which medicines to numb the area (local anesthetic) and medicines to reduce inflammation (steroids) are injected into your affected joint (facet joint block). How is this treated? Treatment for this condition may include:  Stopping or modifying activities that make your condition worse.  Medicines that help reduce pain and inflammation.  Steroid injections to help reduce severe pain.  Physical therapy.  Radiofrequency ablation. This is a surgical procedure that uses high-frequency radio waves to block signals from affected nerves.  Surgery to stabilize your spine or to take pressure off your nerves. This is rare. Follow these instructions at home: Activity  Rest your neck and back as told by your health care provider.  Return to your normal activities as told by your health care provider. Ask your health care provider what activities are safe for you.  If physical therapy was prescribed, do exercises as told by your health care provider. General instructions  Take over-the-counter and prescription medicines only as told by your health care provider.  Do not drive or operate heavy machinery while taking prescription pain medicines.  Do not use any tobacco products, such as cigarettes, chewing tobacco, and e-cigarettes. Tobacco can delay bone healing. If you need help quitting, ask your health care provider.  Use good posture throughout your daily activities.  Good posture means that your spine is in its natural S-curve position (your spine is neutral), your shoulders are pulled back slightly, and your head is not forward.  Keep all follow-up visits as told by your health care provider. This is important. Contact a health  care provider if:  You have symptoms that get worse or do not improve in 2-4 weeks of treatment.  You have numbness or weakness in any part of your body.  You lose control over your bladder or bowel function. This information is not intended to replace advice given to you by your health care provider. Make sure you discuss any questions you have with your health care provider. Document Released: 01/19/2005 Document Revised: 01/28/2017 Document Reviewed: 10/01/2014 Elsevier Interactive Patient Education  2019 [employer]  Radiofrequency Lesioning Radiofrequency lesioning is a procedure that is performed to relieve pain. The procedure is often used for back, neck, or arm pain. Radiofrequency lesioning involves the use of a machine that creates radio waves to make heat. During the procedure, the heat is applied to the nerve that carries the pain signal. The heat damages the nerve and interferes with the pain signal. Pain relief usually starts about 2 weeks after the procedure and lasts for 6 months to 1 year. Tell a health care provider about:  Any allergies you have.  All medicines you are taking, including vitamins, herbs, eye drops, creams, and over-the-counter medicines.  Any problems you or family members have had with anesthetic medicines.  Any blood disorders you have.  Any surgeries you have had.  Any medical conditions you have.  Whether you are pregnant or may be pregnant. What are the risks? Generally, this is a safe procedure. However, problems may occur, including:  Pain or soreness at the injection site.  Infection at the injection site.  Damage to nerves or blood vessels. What happens before the procedure?  Ask your health care provider about: ? Changing or stopping your regular medicines. This is especially important if you are taking diabetes medicines or blood thinners. ? Taking medicines such as aspirin and ibuprofen. These medicines can thin your blood. Do  not take these medicines before your procedure if your health care provider instructs you not to.  Follow instructions from your health care provider about eating or drinking restrictions.  Plan to have someone take you home after the procedure.  If you go home right after the procedure, plan to have someone with you for 24 hours. What happens during the procedure?  You will be given one or more of the following: ? A medicine to help you relax (sedative). ? A medicine to numb the area (local anesthetic).  You will be awake during the procedure. You will need to be able to talk with the health care provider during the procedure.  With the help of a type of X-ray (fluoroscopy), the health care provider will insert a radiofrequency needle into the area to be treated.  Next, a wire that carries the radio waves (electrode) will be put through the radiofrequency needle. An electrical pulse will be sent through the electrode to verify the correct nerve. You will feel a tingling sensation, and you may have muscle twitching.  Then, the tissue that is around the needle tip will be heated by an electric current that is passed using the radiofrequency machine. This will numb the nerves.  A bandage (dressing) will be put on the insertion area after the procedure is done. The procedure may vary among  health care providers and hospitals. What happens after the procedure?  Your blood pressure, heart rate, breathing rate, and blood oxygen level will be monitored often until the medicines you were given have worn off.  Return to your normal activities as directed by your health care provider. This information is not intended to replace advice given to you by your health care provider. Make sure you discuss any questions you have with your health care provider. Document Released: 09/17/2010 Document Revised: 06/27/2015 Document Reviewed: 02/26/2014 Elsevier Interactive Patient Education  2019 [employer].

## 2018-02-28 NOTE — Assessment & Plan Note (Signed)
Continue Trintellix 20, adding Wellbutrin because his depression is insufficiently controlled and this will hijack our treatment of his back pain.

## 2018-02-28 NOTE — Assessment & Plan Note (Signed)
Injection of left 2/3 intermetatarsal bursa. Return in a month.
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
## 2018-02-28 NOTE — Patient Instructions (Signed)
4-6 weeks.  Start livalo.  Start lisinopril.  Start baclofen.  Will make referral to pain clinic.

## 2018-03-02 DIAGNOSIS — G894 Chronic pain syndrome: Secondary | ICD-10-CM | POA: Insufficient documentation

## 2018-03-22 DIAGNOSIS — F17200 Nicotine dependence, unspecified, uncomplicated: Secondary | ICD-10-CM | POA: Diagnosis not present

## 2018-03-22 DIAGNOSIS — M545 Low back pain: Secondary | ICD-10-CM | POA: Diagnosis not present

## 2018-03-22 DIAGNOSIS — Z2821 Immunization not carried out because of patient refusal: Secondary | ICD-10-CM | POA: Diagnosis not present

## 2018-03-22 DIAGNOSIS — Z87891 Personal history of nicotine dependence: Secondary | ICD-10-CM | POA: Diagnosis not present

## 2018-03-22 DIAGNOSIS — Z79899 Other long term (current) drug therapy: Secondary | ICD-10-CM | POA: Diagnosis not present

## 2018-03-22 LAB — BARBITURATE CONFIRMATION, UR
BENZOYLECGONINE: NEGATIVE
Barbiturate: NEGATIVE
Benzodiazepines: NEGATIVE
CREATININE UR: 299.4 mg/dL
Cannabinoids (THC): POSITIVE
Ecstasy: NEGATIVE
Hydrocodone: NEGATIVE
Methadone Metabolite: NEGATIVE
Methadone: NEGATIVE
Methamphetamine: NEGATIVE
OXYCODONE: NEGATIVE
Opiates: NEGATIVE
Phencyclidine: NEGATIVE
TRICYCLICS, S: NEGATIVE
pH, Initial: 5.3

## 2018-03-27 ENCOUNTER — Other Ambulatory Visit: Payer: Self-pay | Admitting: Physician Assistant

## 2018-03-27 DIAGNOSIS — G2581 Restless legs syndrome: Secondary | ICD-10-CM

## 2018-03-27 DIAGNOSIS — M961 Postlaminectomy syndrome, not elsewhere classified: Secondary | ICD-10-CM

## 2018-03-29 ENCOUNTER — Telehealth: Payer: Self-pay

## 2018-03-29 DIAGNOSIS — M961 Postlaminectomy syndrome, not elsewhere classified: Secondary | ICD-10-CM

## 2018-03-29 DIAGNOSIS — M5440 Lumbago with sciatica, unspecified side: Secondary | ICD-10-CM | POA: Diagnosis not present

## 2018-03-29 DIAGNOSIS — Z79899 Other long term (current) drug therapy: Secondary | ICD-10-CM | POA: Diagnosis not present

## 2018-03-29 DIAGNOSIS — K219 Gastro-esophageal reflux disease without esophagitis: Secondary | ICD-10-CM | POA: Diagnosis not present

## 2018-03-29 NOTE — Telephone Encounter (Signed)
I did, ordering it again, please contact [facility] for scheduling.

## 2018-03-29 NOTE — Telephone Encounter (Signed)
Andrew Bean walked in to see if the facet radiofrequency ablation has been ordered. Please advise.

## 2018-03-30 NOTE — Telephone Encounter (Signed)
[employer], she will schedule pt.

## 2018-04-05 DIAGNOSIS — F17200 Nicotine dependence, unspecified, uncomplicated: Secondary | ICD-10-CM | POA: Diagnosis not present

## 2018-04-05 DIAGNOSIS — Z87891 Personal history of nicotine dependence: Secondary | ICD-10-CM | POA: Diagnosis not present

## 2018-04-05 DIAGNOSIS — R110 Nausea: Secondary | ICD-10-CM | POA: Diagnosis not present

## 2018-04-05 DIAGNOSIS — Z79891 Long term (current) use of opiate analgesic: Secondary | ICD-10-CM | POA: Diagnosis not present

## 2018-04-05 DIAGNOSIS — M961 Postlaminectomy syndrome, not elsewhere classified: Secondary | ICD-10-CM | POA: Diagnosis not present

## 2018-04-11 ENCOUNTER — Ambulatory Visit
Admission: RE | Admit: 2018-04-11 | Discharge: 2018-04-11 | Disposition: A | Discharge status: Home / Self Care | Payer: BLUE CROSS/BLUE SHIELD | Source: Ambulatory Visit | Attending: Sports Medicine | Admitting: Sports Medicine

## 2018-04-11 ENCOUNTER — Encounter: Payer: Self-pay | Admitting: Physician Assistant

## 2018-04-11 ENCOUNTER — Other Ambulatory Visit: Payer: Self-pay | Admitting: Sports Medicine

## 2018-04-11 ENCOUNTER — Ambulatory Visit: Payer: BLUE CROSS/BLUE SHIELD | Admitting: Sports Medicine

## 2018-04-11 ENCOUNTER — Ambulatory Visit: Payer: BLUE CROSS/BLUE SHIELD | Admitting: Physician Assistant

## 2018-04-11 VITALS — BP 124/76 | HR 78 | Ht 74.0 in | Wt 206.0 lb

## 2018-04-11 DIAGNOSIS — E785 Hyperlipidemia, unspecified: Secondary | ICD-10-CM

## 2018-04-11 DIAGNOSIS — M545 Low back pain: Secondary | ICD-10-CM | POA: Diagnosis not present

## 2018-04-11 DIAGNOSIS — I10 Essential (primary) hypertension: Secondary | ICD-10-CM

## 2018-04-11 DIAGNOSIS — F321 Major depressive disorder, single episode, moderate: Secondary | ICD-10-CM

## 2018-04-11 DIAGNOSIS — M961 Postlaminectomy syndrome, not elsewhere classified: Secondary | ICD-10-CM

## 2018-04-11 MED ORDER — EZETIMIBE 10 MG PO TABS: 10.0000 mg | ORAL_TABLET | Freq: Every day | ORAL | 3 refills | Status: DC

## 2018-04-11 NOTE — Patient Instructions (Signed)
Colesevelam oral suspension What is this medicine? COLESEVELAM (koh le SEV e lam) is used to lower cholesterol in children and adults. It is also used to lower blood sugar in adults with type 2 diabetes. This medicine should be used in combination with diet and exercise This medicine may be used for other purposes; ask your health care provider or pharmacist if you have questions. COMMON BRAND NAME(S): WelChol What should I tell my health care provider before I take this medicine? They need to know if you have any of these conditions: -constipation or bowel obstruction -high triglyceride levels -history of pancreatitis caused by high triglyceride levels -phenylketonuria -an unusual or allergic reaction to colesevelam, other medicines, foods, dyes, or preservatives -pregnant or trying to get pregnant -breast-feeding How should I use this medicine? This medicine is mixed in water and taken by mouth. DO NOT take this medicine in the dry form. Follow the directions on the prescription label. Empty the granules into a glass and add 4 to 8 ounces of water, fruit juice, or diet soda. Stir well and drink. Take your medicine at regular intervals. Do not take it more often than directed. Talk to your pediatrician regarding the use of this medicine in children. While this drug may be prescribed for children as young as 10 years for selected conditions, precautions do apply. Overdosage: If you think you have taken too much of this medicine contact a poison control center or emergency room at once. NOTE: This medicine is only for you. Do not share this medicine with others. What if I miss a dose? If you miss a dose, take it as soon as you can with your next meal. If it is almost time for your next dose, take only that dose. Do not take double or extra doses. What may interact with this medicine? -birth control pills -cyclosporine -insulin -medicines for diabetes like glimepiride, glipizide, and  glyburide -medicines for seizures like carbamazepine, phenobarbital, and phenytoin -metformin -olmesartan -thyroid hormones -verapamil -vitamins -warfarin This list may not describe all possible interactions. Give your health care provider a list of all the medicines, herbs, non-prescription drugs, or dietary supplements you use. Also tell them if you smoke, drink alcohol, or use illegal drugs. Some items may interact with your medicine. What should I watch for while using this medicine? Visit your doctor or health care professional for regular checks on your progress. Your blood sugar and other tests will be measured regularly. This medicine is only part of a total cholesterol or blood sugar-lowering program. Your health care professional or dietician can suggest a low-cholesterol and low-fat diet that will reduce your risk of getting heart and blood vessel disease. Avoid alcohol and smoking, and keep a proper exercise schedule. To reduce the chance of getting constipated, drink plenty of water and increase the amount of fiber in your diet. Ask your doctor or health care professional for advice if you are constipated. If you are taking this medicine for diabetes, wear a medical ID bracelet or chain, and carry a card that describes your disease and details of your medicine and dosage times. This medicine may cause a decrease in folic acid. You should make sure that you get enough folic acid while you are taking this medicine. Discuss the foods you eat and the vitamins you take with your health care professional. What side effects may I notice from receiving this medicine? Side effects that you should report to your doctor or health care professional as soon as possible: -  allergic reactions like skin rash, itching or hives, swelling of the face, lips, or tongue -bloody or black, tarry stools -breathing problems -muscle pain -nausea, vomiting -severe stomach pain Side effects that usually do not  require medical attention (report to your doctor or health care professional if they continue or are bothersome): -heartburn or indigestion -stomach upset This list may not describe all possible side effects. Call your doctor for medical advice about side effects. You may report side effects to FDA at 1-800-FDA-1088. Where should I keep my medicine? Keep out of the reach of children. Store at room temperature between 15 and 30 degrees C (59 and 86 degrees F). Protect from moisture. Throw away any unused medicine after the expiration date. NOTE: This sheet is a summary. It may not cover all possible information. If you have questions about this medicine, talk to your doctor, pharmacist, or health care provider.  2019 Elsevier/Gold Standard (2016-09-07 15:43:41) Ezetimibe Tablets What is this medicine? EZETIMIBE (ez ET i mibe) blocks the absorption of cholesterol from the stomach. It can help lower blood cholesterol for patients who are at risk of getting heart disease or a stroke. It is only for patients whose cholesterol level is not controlled by diet. This medicine may be used for other purposes; ask your health care provider or pharmacist if you have questions. COMMON BRAND NAME(S): Zetia What should I tell my health care provider before I take this medicine? They need to know if you have any of these conditions: -liver disease -an unusual or allergic reaction to ezetimibe, medicines, foods, dyes, or preservatives -pregnant or trying to get pregnant -breast-feeding How should I use this medicine? Take this medicine by mouth with a glass of water. Follow the directions on the prescription label. This medicine can be taken with or without food. Take your doses at regular intervals. Do not take your medicine more often than directed. Talk to your pediatrician regarding the use of this medicine in children. Special care may be needed. Overdosage: If you think you have taken too much of this  medicine contact a poison control center or emergency room at once. NOTE: This medicine is only for you. Do not share this medicine with others. What if I miss a dose? If you miss a dose, take it as soon as you can. If it is almost time for your next dose, take only that dose. Do not take double or extra doses. What may interact with this medicine? Do not take this medicine with any of the following medications: -fenofibrate -gemfibrozil This medicine may also interact with the following medications: -antacids -cyclosporine -herbal medicines like red yeast rice -other medicines to lower cholesterol or triglycerides This list may not describe all possible interactions. Give your health care provider a list of all the medicines, herbs, non-prescription drugs, or dietary supplements you use. Also tell them if you smoke, drink alcohol, or use illegal drugs. Some items may interact with your medicine. What should I watch for while using this medicine? Visit your doctor or health care professional for regular checks on your progress. You will need to have your cholesterol levels checked. If you are also taking some other cholesterol medicines, you will also need to have tests to make sure your liver is working properly. Tell your doctor or health care professional if you get any unexplained muscle pain, tenderness, or weakness, especially if you also have a fever and tiredness. You need to follow a low-cholesterol, low-fat diet while you are taking  this medicine. This will decrease your risk of getting heart and blood vessel disease. Exercising and avoiding alcohol and smoking can also help. Ask your doctor or dietician for advice. What side effects may I notice from receiving this medicine? Side effects that you should report to your doctor or health care professional as soon as possible: -allergic reactions like skin rash, itching or hives, swelling of the face, lips, or tongue -dark yellow or brown  urine -unusually weak or tired -yellowing of the skin or eyes Side effects that usually do not require medical attention (report to your doctor or health care professional if they continue or are bothersome): -diarrhea -dizziness -headache -stomach upset or pain This list may not describe all possible side effects. Call your doctor for medical advice about side effects. You may report side effects to FDA at 1-800-FDA-1088. Where should I keep my medicine? Keep out of the reach of children. Store at room temperature between 15 and 30 degrees C (59 and 86 degrees F). Protect from moisture. Keep container tightly closed. Throw away any unused medicine after the expiration date. NOTE: This sheet is a summary. It may not cover all possible information. If you have questions about this medicine, talk to your doctor, pharmacist, or health care provider.  2019 Elsevier/Gold Standard (2011-07-27 15:39:09)

## 2018-04-11 NOTE — Progress Notes (Signed)
   Subjective:    Patient ID: Andrew Bean, male    DOB: 10/21/1975, 43 y.o.   MRN: [id number]  HPI  Pt is a 43 yo male with chronic low back pain, HTN, HLD, MDD who presents to the clinic for follow up.   He was not able to tolerate lisinopril. Per patient vomiting when started medication. Once stopped medication stopped vomiting. BP been more controlled. No CP, palpitations, or headaches.   He continue to have myalgias on livalo. Wanted to try zetia.   He is followed by pain clinic for norco. Continues on lyrica and trintellix. He has injection and ablation scheduled to see if will help with back pain in the next week. Last injection did improve leg weakness and radicular pain.   .. Active Ambulatory Problems    Diagnosis Date Noted  . Elevated blood pressure 09/03/2015  . Tobacco dependence 09/03/2015  . Lumbar post-laminectomy syndrome 09/03/2015  . Insomnia 09/03/2015  . Restless leg syndrome, uncontrolled 09/03/2015  . Essential hypertension, benign 09/17/2015  . Depression, major, single episode, moderate (HCC) 02/14/2017  . Dyslipidemia 03/04/2017  . Bursitis of intermetatarsal bursa of left foot 02/28/2018  . Chronic pain syndrome 03/02/2018   Resolved Ambulatory Problems    Diagnosis Date Noted  . Radiculopathy of lumbar region 08/19/2010  . Spinal stenosis at L4-L5 level 05/24/2014  . Degenerative disc disease, lumbar 01/10/2015  . Cramp of both lower extremities 09/03/2015   Past Medical History:  Diagnosis Date  . Headache   . Hyperlipemia   . Hypertension       Review of Systems See HPI.     Objective:   Physical Exam Vitals signs reviewed.  Constitutional:      Appearance: Normal appearance.  HENT:     Head: Normocephalic.  Cardiovascular:     Rate and Rhythm: Normal rate and regular rhythm.     Pulses: Normal pulses.  Neurological:     General: No focal deficit present.     Mental Status: He is alert and oriented to person, place, and time.   Psychiatric:        Mood and Affect: Mood normal.        Behavior: Behavior normal.           Assessment & Plan:   .Marland KitchenAh was seen today for follow-up.  Diagnoses and all orders for this visit:  Lumbar post-laminectomy syndrome  Depression, major, single episode, moderate (HCC)  Essential hypertension, benign  Dyslipidemia -     ezetimibe (ZETIA) 10 MG tablet; Take 1 tablet (10 mg total) by mouth daily.   Pt sees pain clinic for management of norco.  Pt continues on trinelllix and lyrica with minimal relief.   Pt continues to be depressed due to pain. Discussed counseling. Pt declined. We have a great chronic disease counselor in office.   BP controlled today. No medication changes made. Continue to monitor at home.   Discussed cholesterol options. Discussed lowering LDL is still goal but statins have better mortality and morbidity data. Will rx. Discussed consider welchol as well.

## 2018-04-12 ENCOUNTER — Encounter: Payer: Self-pay | Admitting: Physician Assistant

## 2018-04-15 ENCOUNTER — Inpatient Hospital Stay: Admission: RE | Admit: 2018-04-15 | Payer: BLUE CROSS/BLUE SHIELD | Source: Ambulatory Visit

## 2018-04-15 ENCOUNTER — Telehealth: Payer: Self-pay

## 2018-04-15 ENCOUNTER — Encounter: Payer: Self-pay | Admitting: Physician Assistant

## 2018-04-15 NOTE — Telephone Encounter (Signed)
Pt's wife called stating pt's BP is still running high. Wanting to know if Lesly Rubenstein would send something stronger for BP   Please advise

## 2018-04-18 ENCOUNTER — Ambulatory Visit: Payer: BLUE CROSS/BLUE SHIELD | Admitting: Sports Medicine

## 2018-04-18 MED ORDER — LOSARTAN POTASSIUM-HCTZ 50-12.5 MG PO TABS: 1.0000 | ORAL_TABLET | Freq: Every day | ORAL | 2 refills | Status: DC

## 2018-04-18 NOTE — Telephone Encounter (Signed)
Pt advised. States he will have BP checked when he sees Dr T on 04/27/18

## 2018-04-18 NOTE — Telephone Encounter (Signed)
Sent hyzaar. Continue to monitor BP. Follow up nurse visit 2 weeks for recheck BP.

## 2018-04-20 ENCOUNTER — Ambulatory Visit
Admission: RE | Admit: 2018-04-20 | Discharge: 2018-04-20 | Disposition: A | Discharge status: Home / Self Care | Payer: BLUE CROSS/BLUE SHIELD | Source: Ambulatory Visit | Attending: Sports Medicine | Admitting: Sports Medicine

## 2018-04-20 ENCOUNTER — Other Ambulatory Visit: Payer: Self-pay

## 2018-04-20 ENCOUNTER — Other Ambulatory Visit: Payer: Self-pay | Admitting: Sports Medicine

## 2018-04-20 DIAGNOSIS — M961 Postlaminectomy syndrome, not elsewhere classified: Secondary | ICD-10-CM | POA: Diagnosis not present

## 2018-04-20 MED ORDER — SODIUM CHLORIDE 0.9 % IV SOLN
Freq: Once | INTRAVENOUS | Status: AC
  Administered 2018-04-20: 09:00:00 via INTRAVENOUS

## 2018-04-20 MED ORDER — KETOROLAC TROMETHAMINE 30 MG/ML IJ SOLN: 30.0000 mg | Freq: Once | INTRAMUSCULAR | Status: DC

## 2018-04-20 MED ORDER — FENTANYL CITRATE (PF) 100 MCG/2ML IJ SOLN
25.0000 ug | INTRAMUSCULAR | Status: DC | PRN
  Administered 2018-04-20: 50 ug via INTRAVENOUS

## 2018-04-20 MED ORDER — MIDAZOLAM HCL 2 MG/2ML IJ SOLN
1.0000 mg | INTRAMUSCULAR | Status: DC | PRN
  Administered 2018-04-20: 2 mg via INTRAVENOUS

## 2018-04-20 NOTE — Discharge Instructions (Signed)
Radio Frequency Ablation Post Procedure Discharge Instructions ° °1. May resume a regular diet and any medications that you routinely take (including pain medications). °2. No driving day of procedure. °3. Upon discharge go home and rest for at least 4 hours.  May use an ice pack as needed to injection sites on back. °4. Remove bandades later, today. ° ° ° °Please contact our office at [phone] for the following symptoms: ° °· Fever greater than 100 degrees °· Increased swelling, pain, or redness at injection site. ° ° °Thank you for visiting [facility]. °

## 2018-04-20 NOTE — Progress Notes (Signed)
Pt currently resting in nurses station. Pt has no complaints to offer at this time. Pt is alert and speaking in complete sentences.

## 2018-04-26 ENCOUNTER — Other Ambulatory Visit: Payer: BLUE CROSS/BLUE SHIELD

## 2018-04-27 ENCOUNTER — Ambulatory Visit: Payer: BLUE CROSS/BLUE SHIELD | Admitting: Sports Medicine

## 2018-05-02 ENCOUNTER — Other Ambulatory Visit: Payer: Self-pay | Admitting: Physician Assistant

## 2018-05-02 DIAGNOSIS — G2581 Restless legs syndrome: Secondary | ICD-10-CM

## 2018-05-02 DIAGNOSIS — M961 Postlaminectomy syndrome, not elsewhere classified: Secondary | ICD-10-CM

## 2018-06-02 ENCOUNTER — Other Ambulatory Visit: Payer: Self-pay | Admitting: Physician Assistant

## 2018-06-02 DIAGNOSIS — I10 Essential (primary) hypertension: Secondary | ICD-10-CM

## 2018-07-19 ENCOUNTER — Other Ambulatory Visit: Payer: Self-pay | Admitting: Sports Medicine

## 2018-07-19 DIAGNOSIS — F321 Major depressive disorder, single episode, moderate: Secondary | ICD-10-CM

## 2018-10-19 ENCOUNTER — Other Ambulatory Visit: Payer: Self-pay

## 2018-10-19 ENCOUNTER — Encounter: Payer: Self-pay | Admitting: Sports Medicine

## 2018-10-19 ENCOUNTER — Ambulatory Visit (INDEPENDENT_AMBULATORY_CARE_PROVIDER_SITE_OTHER): Payer: BLUE CROSS/BLUE SHIELD | Admitting: Sports Medicine

## 2018-10-19 DIAGNOSIS — M7752 Other enthesopathy of left foot: Secondary | ICD-10-CM

## 2018-10-19 DIAGNOSIS — M961 Postlaminectomy syndrome, not elsewhere classified: Secondary | ICD-10-CM | POA: Diagnosis not present

## 2018-10-19 MED ORDER — PREGABALIN 100 MG PO CAPS: 100.0000 mg | ORAL_CAPSULE | Freq: Two times a day (BID) | ORAL | 3 refills | Status: DC

## 2018-10-19 MED ORDER — PREDNISONE 50 MG PO TABS: ORAL_TABLET | ORAL | 0 refills | Status: DC

## 2018-10-19 NOTE — Assessment & Plan Note (Addendum)
Progressive DDD at L5-S1 with a left-sided protrusion, a left-sided L5-S1 epidural did not provide any relief, not even temporary. Bilateral L5-S1 facet joint injections provided good relief albeit temporary. He did have a good response to a bilateral L5-S1 facet radiofrequency ablation back in March, now starting to have worsening of pain, I suspect it is wearing off. I am going to add some prednisone to take the edge off for now. I would like him to talk to Dr. Dorene Ar for discussion of repeat L5-S1 medial branch blocks and radiofrequency ablation. Dropping Lyrica to 100 mg twice daily, he is having trouble tolerating 150 twice a day. He will continue Trintellix 20, Wellbutrin, Robaxin. Did not have a good response to amitriptyline.
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
## 2018-10-19 NOTE — Assessment & Plan Note (Signed)
We injected his left 2/3 intermetatarsal bursa without any improvement back in January of this year. I would like him to see Dr. Raeford Razor for discussion of custom molded orthotics. This may be as simple as metatarsalgia and he may just need a metatarsal pad in the orthotic.

## 2018-10-27 DIAGNOSIS — M545 Low back pain: Secondary | ICD-10-CM | POA: Diagnosis not present

## 2018-10-27 DIAGNOSIS — M4726 Other spondylosis with radiculopathy, lumbar region: Secondary | ICD-10-CM | POA: Diagnosis not present

## 2018-10-27 DIAGNOSIS — M5136 Other intervertebral disc degeneration, lumbar region: Secondary | ICD-10-CM | POA: Diagnosis not present

## 2018-10-27 DIAGNOSIS — M48061 Spinal stenosis, lumbar region without neurogenic claudication: Secondary | ICD-10-CM | POA: Diagnosis not present

## 2018-11-02 ENCOUNTER — Encounter: Payer: Self-pay | Admitting: Family Medicine

## 2018-11-02 ENCOUNTER — Other Ambulatory Visit: Payer: Self-pay

## 2018-11-02 ENCOUNTER — Ambulatory Visit (INDEPENDENT_AMBULATORY_CARE_PROVIDER_SITE_OTHER): Payer: BLUE CROSS/BLUE SHIELD | Admitting: Family Medicine

## 2018-11-02 DIAGNOSIS — M7742 Metatarsalgia, left foot: Secondary | ICD-10-CM
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
## 2018-11-02 NOTE — Patient Instructions (Signed)
Nice to meet you Try the padding. If it works we can put more on the sandals.  We could try the green insoles if you want to try wearing shoes.   Please send me a message in MyChart with any questions or updates.  Please see [place] back as needed.   --Dr. Raeford Razor

## 2018-11-02 NOTE — Assessment & Plan Note (Signed)
Symptoms seem to be occurring regardless of the treatment of his back.  May be unassociated with the sciatica that experiences on that side.  Could be related to the abnormal gait that he has to walk with due to the pain on that side.  Does have some splaying of the forefoot with changes observed on his gait. -Placed metatarsal pad and lateral heel wedge on his sandals. -Could consider placing these in shoes if he feels like he wants to try to wear a shoe.  Would place a green sport insole in the shoe.  May not quite tolerate a custom orthotic at this time. -Counseled on the AFO use.

## 2018-11-29 DIAGNOSIS — G8929 Other chronic pain: Secondary | ICD-10-CM | POA: Diagnosis not present

## 2018-11-29 DIAGNOSIS — Z888 Allergy status to other drugs, medicaments and biological substances status: Secondary | ICD-10-CM | POA: Diagnosis not present

## 2018-11-29 DIAGNOSIS — Z79899 Other long term (current) drug therapy: Secondary | ICD-10-CM | POA: Diagnosis not present

## 2018-11-29 DIAGNOSIS — Z79891 Long term (current) use of opiate analgesic: Secondary | ICD-10-CM | POA: Diagnosis not present

## 2018-11-29 DIAGNOSIS — Z981 Arthrodesis status: Secondary | ICD-10-CM | POA: Diagnosis not present

## 2018-11-29 DIAGNOSIS — M47816 Spondylosis without myelopathy or radiculopathy, lumbar region: Secondary | ICD-10-CM | POA: Diagnosis not present

## 2018-11-29 DIAGNOSIS — Z91030 Bee allergy status: Secondary | ICD-10-CM | POA: Diagnosis not present

## 2018-11-29 DIAGNOSIS — E7849 Other hyperlipidemia: Secondary | ICD-10-CM | POA: Diagnosis not present

## 2018-11-29 DIAGNOSIS — I10 Essential (primary) hypertension: Secondary | ICD-10-CM | POA: Diagnosis not present

## 2019-01-10 ENCOUNTER — Ambulatory Visit (INDEPENDENT_AMBULATORY_CARE_PROVIDER_SITE_OTHER): Payer: BC Managed Care – PPO | Admitting: Physician Assistant

## 2019-01-10 ENCOUNTER — Ambulatory Visit (INDEPENDENT_AMBULATORY_CARE_PROVIDER_SITE_OTHER): Payer: BC Managed Care – PPO | Admitting: Sports Medicine

## 2019-01-10 ENCOUNTER — Encounter: Payer: Self-pay | Admitting: Physician Assistant

## 2019-01-10 ENCOUNTER — Other Ambulatory Visit: Payer: Self-pay

## 2019-01-10 ENCOUNTER — Encounter: Payer: Self-pay | Admitting: Sports Medicine

## 2019-01-10 VITALS — BP 125/85 | HR 91 | Ht 73.0 in | Wt 197.0 lb

## 2019-01-10 DIAGNOSIS — G4700 Insomnia, unspecified: Secondary | ICD-10-CM | POA: Diagnosis not present

## 2019-01-10 DIAGNOSIS — Z79899 Other long term (current) drug therapy: Secondary | ICD-10-CM

## 2019-01-10 DIAGNOSIS — M961 Postlaminectomy syndrome, not elsewhere classified: Secondary | ICD-10-CM

## 2019-01-10 DIAGNOSIS — Z23 Encounter for immunization: Secondary | ICD-10-CM

## 2019-01-10 DIAGNOSIS — E785 Hyperlipidemia, unspecified: Secondary | ICD-10-CM | POA: Diagnosis not present

## 2019-01-10 DIAGNOSIS — N5082 Scrotal pain: Secondary | ICD-10-CM

## 2019-01-10 DIAGNOSIS — G894 Chronic pain syndrome: Secondary | ICD-10-CM

## 2019-01-10 DIAGNOSIS — I10 Essential (primary) hypertension: Secondary | ICD-10-CM

## 2019-01-10 DIAGNOSIS — F321 Major depressive disorder, single episode, moderate: Secondary | ICD-10-CM

## 2019-01-10 MED ORDER — CYCLOBENZAPRINE HCL 10 MG PO TABS: 10.0000 mg | ORAL_TABLET | Freq: Three times a day (TID) | ORAL | 1 refills | Status: DC | PRN

## 2019-01-10 MED ORDER — TRAZODONE HCL 50 MG PO TABS: 25.0000 mg | ORAL_TABLET | Freq: Every evening | ORAL | 2 refills | Status: DC | PRN

## 2019-01-10 MED ORDER — TRIAZOLAM 0.25 MG PO TABS: ORAL_TABLET | ORAL | 0 refills | Status: DC

## 2019-01-10 NOTE — Assessment & Plan Note (Signed)
Pain in the right hemiscrotum, I can feel fullness in the spermatic cord as well. Previous ultrasound was negative but likely done with the patient supine. I have recommended scrotal support, I also like recommendation from urology. He does have some lower abdominal pain though think is related to his testicular/scrotal pain. No hernia on exam.

## 2019-01-10 NOTE — Progress Notes (Signed)
   Subjective:    Patient ID: Andrew Bean, male    DOB: January 23, 1976, 43 y.o.   MRN: [id number]  HPI  Pt is a 43 yo male with chronic pain, lumbar post laminectomy syndrome, insomnia, depression who presents to the clinic for follow up.   He just saw Dr. Darene Lamer for chronic pain today. Pt admits to not being on any medication. He stopped everything to see how good the ablation did about 1 month ago with Dr. Francesco Runner. He did have some improvement but still has significant pain. Dr. Darene Lamer restarted lyria and cymbalta today. He ordered EMGs again.   Pt continues to not be able to sleep. Mostly due to pain. Would like something for sleep.  .. Active Ambulatory Problems    Diagnosis Date Noted  . Elevated blood pressure 09/03/2015  . Tobacco dependence 09/03/2015  . Lumbar post-laminectomy syndrome 09/03/2015  . Insomnia 09/03/2015  . Restless leg syndrome, uncontrolled 09/03/2015  . Essential hypertension, benign 09/17/2015  . Depression, major, single episode, moderate ([place]) 02/14/2017  . Dyslipidemia 03/04/2017  . Bursitis of intermetatarsal bursa of left foot 02/28/2018  . Chronic pain syndrome 03/02/2018  . Metatarsalgia of left foot 11/02/2018  . Scrotal pain 01/10/2019   Resolved Ambulatory Problems    Diagnosis Date Noted  . Radiculopathy of lumbar region 08/19/2010  . Spinal stenosis at L4-L5 level 05/24/2014  . Degenerative disc disease, lumbar 01/10/2015  . Cramp of both lower extremities 09/03/2015   Past Medical History:  Diagnosis Date  . Headache   . Hyperlipemia   . Hypertension       Review of Systems  All other systems reviewed and are negative.      Objective:   Physical Exam Vitals reviewed.  Constitutional:      Appearance: Normal appearance.  Cardiovascular:     Rate and Rhythm: Normal rate and regular rhythm.  Pulmonary:     Effort: Pulmonary effort is normal.     Breath sounds: Normal breath sounds.  Neurological:     General: No focal deficit present.      Mental Status: He is alert and oriented to person, place, and time.  Psychiatric:        Mood and Affect: Mood normal.           Assessment & Plan:  Marland KitchenMarland KitchenDiagnoses and all orders for this visit:  Insomnia, unspecified type -     traZODone (DESYREL) 50 MG tablet; Take 0.5-1 tablets (25-50 mg total) by mouth at bedtime as needed for sleep.  Dyslipidemia -     Lipid Panel w/reflex Direct LDL  Medication management -     COMPLETE METABOLIC PANEL WITH GFR  Flu vaccine need -     REGULAR FLU SHOT  Essential hypertension, benign  Depression, major, single episode, moderate ([facility])  Chronic pain syndrome -     cyclobenzaprine (FLEXERIL) 10 MG tablet; Take 1 tablet (10 mg total) by mouth 3 (three) times daily as needed for muscle spasms.   Needs fasting labs.  Start trazodone.  Added muscle relaxer to pain regimen.  Continue follow up with Dr. Darene Lamer. Get EMGs.

## 2019-01-10 NOTE — Assessment & Plan Note (Signed)
Progressive DDD L5-S1 with a left-sided fusion, however left-sided L5-S1 epidural did not provide any relief. Bilateral L5-S1 facet radiofrequency ablation recently has improved some of his back pain with radiation into the hips and posterior thighs. He discontinued his Lyrica, I have recommended that he restart it as well as his Trintellix. Did not respond well to amitriptyline. He is reporting intermittent weakness in the legs, particularly after colitis. Though I do not think this is actual pathology I would like to go ahead and have him complete a full nerve conduction and EMG of lower extremities, he was unable to tolerate it previously, will add triazolam this time.
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
## 2019-01-16 ENCOUNTER — Encounter: Payer: Self-pay | Admitting: Physician Assistant

## 2019-01-19 ENCOUNTER — Telehealth: Payer: Self-pay | Admitting: Sports Medicine

## 2019-01-19 NOTE — Telephone Encounter (Signed)
Dr. Oneta Rack from [facility] called stating they tried to do a Nerve Conduction on Mr. Mazzoni last year and he was unable to have it.  She had one of the physicians look over the chart and they recommend sending him to a academic center. Please advise.   Jenny Reichmann

## 2019-01-19 NOTE — Telephone Encounter (Signed)
Call them back and tell them I had added a moderate dose of triazolam for him to take before the procedure when I ordered it for this very reason.

## 2019-01-19 NOTE — Telephone Encounter (Signed)
Dr Darene Lamer   I called [facility] and spoke to [place] she states Dr. Leta Baptist is aware of the triazolam and still does not feel they are a good fit and is declining the referral would you like for me to send to [facility]?

## 2019-01-19 NOTE — Telephone Encounter (Signed)
Faxed order to [facility] at [phone] - CF

## 2019-01-19 NOTE — Telephone Encounter (Signed)
Sigh.  Yes, please.

## 2019-01-31 ENCOUNTER — Encounter: Payer: Self-pay | Admitting: Neurology

## 2019-01-31 ENCOUNTER — Other Ambulatory Visit: Payer: Self-pay

## 2019-01-31 DIAGNOSIS — M961 Postlaminectomy syndrome, not elsewhere classified: Secondary | ICD-10-CM

## 2019-02-15 ENCOUNTER — Encounter: Payer: BC Managed Care – PPO | Admitting: Neurology

## 2019-03-01 ENCOUNTER — Encounter: Payer: BC Managed Care – PPO | Admitting: Neurology

## 2019-03-15 ENCOUNTER — Other Ambulatory Visit: Payer: Self-pay

## 2019-03-15 ENCOUNTER — Encounter: Payer: Self-pay | Admitting: Physician Assistant

## 2019-03-15 ENCOUNTER — Ambulatory Visit (INDEPENDENT_AMBULATORY_CARE_PROVIDER_SITE_OTHER): Payer: BLUE CROSS/BLUE SHIELD | Admitting: Neurology

## 2019-03-15 DIAGNOSIS — M961 Postlaminectomy syndrome, not elsewhere classified: Secondary | ICD-10-CM

## 2019-03-15 DIAGNOSIS — M79604 Pain in right leg: Secondary | ICD-10-CM

## 2019-03-15 NOTE — Procedures (Signed)
[facility]  [place], [place]  [place], [place] [address] Tel: [phone] Fax:  [phone] Test Date:  03/15/2019  Patient: Andrew Bean DOB: [birth year] Physician: Narda Amber, DO  Sex: Male Height: 6\' 1"  Ref Phys: Aundria Mems, MD  ID#: [id number] Temp: 32.0C Technician:    Patient Complaints: This is a 44 year old man with history of lumbar surgery referred for evaluation of chronic low back and bilateral leg pain.  NCV & EMG Findings: Extensive electrodiagnostic testing of the right lower extremity and additional studies of the left shows:  1. Bilateral sural and superficial peroneal sensory responses are within normal limits. 2. Bilateral peroneal motor responses at the extensor digitorum brevis are absent, and normal at the tibialis anterior; this pattern is a normal finding. Bilateral tibial motor responses are within normal limits. 3. Bilateral tibial H reflex studies are within normal limits. 4. There is no evidence of active or chronic motor axonal loss changes affecting any of the tested muscles.  There is a global pattern of incomplete motor unit activation as seen by variable recruitment, which is most likely due to pain or poor effort.  Impression: This is a normal study of the lower extremities.  In particular, there is no evidence of a sensorimotor polyneuropathy or lumbosacral radiculopathy.   ___________________________ Narda Amber, DO    Nerve Conduction Studies Anti Sensory Summary Table   Site NR Peak (ms) Norm Peak (ms) P-T Amp (V) Norm P-T Amp  Left Sup Peroneal Anti Sensory (Ant Lat Mall)  32C  12 cm    2.4 <4.5 12.5 >5  Right Sup Peroneal Anti Sensory (Ant Lat Mall)  32C  12 cm    2.8 <4.5 11.0 >5  Left Sural Anti Sensory (Lat Mall)  32C  Calf    3.0 <4.5 11.5 >5  Right Sural Anti Sensory (Lat Mall)  32C  Calf    3.6 <4.5 11.6 >5   Motor Summary Table   Site NR Onset (ms) Norm Onset (ms) O-P Amp (mV) Norm O-P  Amp Site1 Site2 Delta-0 (ms) Dist (cm) Vel (m/s) Norm Vel (m/s)  Left Peroneal Motor (Ext Dig Brev)  32C  Ankle NR  <5.5  >3 B Fib Ankle  0.0  >40  B Fib NR     Poplt B Fib  0.0  >40  Poplt NR            Right Peroneal Motor (Ext Dig Brev)  32C  Ankle NR  <5.5  >3 B Fib Ankle  0.0  >40  B Fib    16.6  1.1  Poplt B Fib 0.8 0.0  >40  Poplt    17.4  1.1         Left Peroneal TA Motor (Tib Ant)  32C  Fib Head    3.8 <4.0 5.1 >4 Poplit Fib Head 1.2 8.0 67 >40  Poplit    5.0  4.9         Right Peroneal TA Motor (Tib Ant)  32C  Fib Head    3.4 <4.0 4.6 >4 Poplit Fib Head 1.4 9.0 64 >40  Poplit    4.8  4.4         Left Tibial Motor (Abd Hall Brev)  32C  Ankle    3.8 <6.0 14.6 >8 Knee Ankle 6.6 41.0 62 >40  Knee    10.4  11.6         Right Tibial Motor (Abd Hall Brev)  32C  Ankle    3.7 <6.0 17.1 >8 Knee Ankle 10.2 49.0 48 >40  Knee    13.9  11.1          H Reflex Studies   NR H-Lat (ms) Lat Norm (ms) L-R H-Lat (ms)  Left Tibial (Gastroc)  32C     34.56 <35 3.68  Right Tibial (Gastroc)  32C     30.88 <35 3.68   EMG   Side Muscle Ins Act Fibs Psw Fasc Number Recrt Dur Dur. Amp Amp. Poly Poly. Comment  Right AntTibialis Nml Nml Nml Nml 2- Mod-V Nml Nml Nml Nml Nml Nml N/A  Right Gastroc Nml Nml Nml Nml 2- Mod-V Nml Nml Nml Nml Nml Nml N/A  Right Flex Dig Long Nml Nml Nml Nml 2- Mod-V Nml Nml Nml Nml Nml Nml N/A  Right RectFemoris Nml Nml Nml Nml Nml Nml Nml Nml Nml Nml Nml Nml N/A  Right GluteusMed Nml Nml Nml Nml Nml Nml Nml Nml Nml Nml Nml Nml N/A  Right BicepsFemS Nml Nml Nml Nml Nml Nml Nml Nml Nml Nml Nml Nml N/A  Left BicepsFemS Nml Nml Nml Nml Nml Nml Nml Nml Nml Nml Nml Nml N/A  Left AntTibialis Nml Nml Nml Nml 3- Mod-V Nml Nml Nml Nml Nml Nml N/A  Left Gastroc Nml Nml Nml Nml 2- Mod-V Nml Nml Nml Nml Nml Nml N/A  Left Flex Dig Long Nml Nml Nml Nml 2- Mod-V Nml Nml Nml Nml Nml Nml N/A  Left RectFemoris Nml Nml Nml Nml Nml Nml Nml Nml Nml Nml Nml Nml N/A  Left  GluteusMed Nml Nml Nml Nml Nml Nml Nml Nml Nml Nml Nml Nml N/A      Waveforms:

## 2019-03-22 MED ORDER — CLONAZEPAM 0.5 MG PO TABS: 0.2500 mg | ORAL_TABLET | Freq: Two times a day (BID) | ORAL | 2 refills | Status: DC | PRN

## 2019-03-23 NOTE — Telephone Encounter (Signed)
Going to need an appointment to discuss any new symptoms, as he was doing better after his facet RFA.

## 2019-03-28 ENCOUNTER — Ambulatory Visit (INDEPENDENT_AMBULATORY_CARE_PROVIDER_SITE_OTHER): Payer: BLUE CROSS/BLUE SHIELD | Admitting: Sports Medicine

## 2019-03-28 ENCOUNTER — Encounter: Payer: Self-pay | Admitting: Sports Medicine

## 2019-03-28 ENCOUNTER — Other Ambulatory Visit: Payer: Self-pay

## 2019-03-28 ENCOUNTER — Ambulatory Visit (INDEPENDENT_AMBULATORY_CARE_PROVIDER_SITE_OTHER): Payer: BLUE CROSS/BLUE SHIELD

## 2019-03-28 DIAGNOSIS — M25462 Effusion, left knee: Secondary | ICD-10-CM

## 2019-03-28 DIAGNOSIS — M961 Postlaminectomy syndrome, not elsewhere classified: Secondary | ICD-10-CM

## 2019-03-28 DIAGNOSIS — M25461 Effusion, right knee: Secondary | ICD-10-CM | POA: Diagnosis not present

## 2019-03-28 DIAGNOSIS — M170 Bilateral primary osteoarthritis of knee: Secondary | ICD-10-CM | POA: Insufficient documentation

## 2019-03-28 DIAGNOSIS — M25561 Pain in right knee: Secondary | ICD-10-CM | POA: Diagnosis not present

## 2019-03-28 NOTE — Assessment & Plan Note (Addendum)
Andrew Bean has also had on and off pain and swelling of both knees, right worse than left for many years now. Today he has a significant right knee effusion, and tenderness at the joint lines. I performed an arthrocentesis with injection. Because he is having some mechanical symptoms including a positive McMurray sign we are going to proceed with x-rays and an MRI. Return to see me in 4 weeks for this.

## 2019-03-28 NOTE — Assessment & Plan Note (Addendum)
Torrian returns, he is a pleasant 44 year old male, he has had an L3-L L5 fusion, the fusion levels look good, on his most recent MRI from 2019 I do see a new disc protrusion at the L5-S1 level. He initially responded to some degree to multilevel facet radiofrequency ablation, but pain today I think is coming from his L5-S1 disc protrusion, this is likely adjacent level disease. We are going to proceed with a new MRI for interventional planning, likely another epidural at the L5-S1 level, his axial pain is midline, radicular pain is more down the left leg. Johnluke is not interested in medical pain management, certainly if injections fail and if surgery is not an option a spinal cord stimulator would potentially be an option.  Eilam has adjacent level disease at L2-L3 with moderate central canal stenosis. Proceeding with bilateral L2-L3 interlaminar epidurals, if insufficient relief he may need extension of his fusion versus simple microdiscectomy at this level.

## 2019-03-28 NOTE — Progress Notes (Addendum)
    Procedures performed today:    None.  Independent interpretation of tests performed by another provider:   None.  Impression and Recommendations:    Lumbar post-laminectomy syndrome Andrew Bean returns, he is a pleasant 44 year old male, he has had an L3-L L5 fusion, the fusion levels look good, on his most recent MRI from 2019 I do see a new disc protrusion at the L5-S1 level. He initially responded to some degree to multilevel facet radiofrequency ablation, but pain today I think is coming from his L5-S1 disc protrusion, this is likely adjacent level disease. We are going to proceed with a new MRI for interventional planning, likely another epidural at the L5-S1 level, his axial pain is midline, radicular pain is more down the left leg. Andrew Bean is not interested in medical pain management, certainly if injections fail and if surgery is not an option a spinal cord stimulator would potentially be an option.  Andrew Bean has adjacent level disease at L2-L3 with moderate central canal stenosis. Proceeding with bilateral L2-L3 interlaminar epidurals, if insufficient relief he may need extension of his fusion versus simple microdiscectomy at this level.  Pain and swelling of right knee Andrew Bean has also had on and off pain and swelling of both knees, right worse than left for many years now. Today he has a significant right knee effusion, and tenderness at the joint lines. I performed an arthrocentesis with injection. Because he is having some mechanical symptoms including a positive McMurray sign we are going to proceed with x-rays and an MRI. Return to see me in 4 weeks for this.    ___________________________________________ [name]. [name], M.D., ABFM., CAQSM. Primary Care and Sports Medicine [facility]  Adjunct Instructor of Family Medicine  [employer]

## 2019-03-30 ENCOUNTER — Encounter: Payer: Self-pay | Admitting: Physician Assistant

## 2019-04-04 ENCOUNTER — Other Ambulatory Visit: Payer: Self-pay | Admitting: Physician Assistant

## 2019-04-04 MED ORDER — CLONAZEPAM 1 MG PO TABS: 1.0000 mg | ORAL_TABLET | Freq: Two times a day (BID) | ORAL | 5 refills | Status: DC | PRN

## 2019-04-09 ENCOUNTER — Other Ambulatory Visit: Payer: Self-pay

## 2019-04-09 ENCOUNTER — Ambulatory Visit (INDEPENDENT_AMBULATORY_CARE_PROVIDER_SITE_OTHER): Payer: BLUE CROSS/BLUE SHIELD

## 2019-04-09 DIAGNOSIS — M25561 Pain in right knee: Secondary | ICD-10-CM

## 2019-04-09 DIAGNOSIS — M25461 Effusion, right knee: Secondary | ICD-10-CM

## 2019-04-09 DIAGNOSIS — M961 Postlaminectomy syndrome, not elsewhere classified: Secondary | ICD-10-CM | POA: Diagnosis not present

## 2019-04-10 NOTE — Addendum Note (Signed)
Addended by: Monica Becton on: 04/10/2019 04:33 PM   Modules accepted: Orders

## 2019-04-12 ENCOUNTER — Other Ambulatory Visit: Payer: Self-pay | Admitting: Sports Medicine

## 2019-04-12 DIAGNOSIS — M961 Postlaminectomy syndrome, not elsewhere classified: Secondary | ICD-10-CM

## 2019-04-21 ENCOUNTER — Ambulatory Visit
Admission: RE | Admit: 2019-04-21 | Discharge: 2019-04-21 | Disposition: A | Discharge status: Home / Self Care | Payer: BLUE CROSS/BLUE SHIELD | Source: Ambulatory Visit | Attending: Sports Medicine | Admitting: Sports Medicine

## 2019-04-21 ENCOUNTER — Other Ambulatory Visit: Payer: Self-pay | Admitting: Sports Medicine

## 2019-04-21 ENCOUNTER — Other Ambulatory Visit: Payer: BLUE CROSS/BLUE SHIELD

## 2019-04-21 DIAGNOSIS — M961 Postlaminectomy syndrome, not elsewhere classified: Secondary | ICD-10-CM

## 2019-04-21 MED ORDER — IOPAMIDOL (ISOVUE-M 200) INJECTION 41%
1.0000 mL | Freq: Once | INTRAMUSCULAR | Status: AC
  Administered 2019-04-21: 11:00:00 1 mL via EPIDURAL

## 2019-04-21 MED ORDER — METHYLPREDNISOLONE ACETATE 40 MG/ML INJ SUSP (RADIOLOG
120.0000 mg | Freq: Once | INTRAMUSCULAR | Status: AC
  Administered 2019-04-21: 120 mg via EPIDURAL

## 2019-04-21 NOTE — Discharge Instructions (Signed)

## 2019-04-22 ENCOUNTER — Encounter: Payer: Self-pay | Admitting: Physician Assistant

## 2019-04-22 DIAGNOSIS — G894 Chronic pain syndrome: Secondary | ICD-10-CM

## 2019-05-01 MED ORDER — CYCLOBENZAPRINE HCL 10 MG PO TABS: 10.0000 mg | ORAL_TABLET | Freq: Three times a day (TID) | ORAL | 0 refills | Status: DC | PRN

## 2019-05-01 NOTE — Telephone Encounter (Signed)
RX sent to [facility]

## 2019-05-31 ENCOUNTER — Encounter: Payer: Self-pay | Admitting: Physician Assistant

## 2019-05-31 ENCOUNTER — Other Ambulatory Visit: Payer: Self-pay | Admitting: Physician Assistant

## 2019-05-31 DIAGNOSIS — G894 Chronic pain syndrome: Secondary | ICD-10-CM

## 2019-06-19 ENCOUNTER — Other Ambulatory Visit: Payer: Self-pay | Admitting: Sports Medicine

## 2019-06-19 DIAGNOSIS — F321 Major depressive disorder, single episode, moderate: Secondary | ICD-10-CM

## 2019-06-19 NOTE — Telephone Encounter (Signed)
To PCP

## 2019-06-20 ENCOUNTER — Telehealth: Payer: Self-pay | Admitting: Physician Assistant

## 2019-06-20 NOTE — Telephone Encounter (Signed)
Received fax for PA on [employer] sent through cover my meds waiting on determination. - CF 

## 2019-06-21 NOTE — Telephone Encounter (Signed)
Received fax from [facility] they approved [employer]   Reference; [id number] Valid: 06/20/2019 - 06/18/2022 - CF

## 2019-07-04 ENCOUNTER — Other Ambulatory Visit: Payer: Self-pay | Admitting: Sports Medicine

## 2019-07-04 ENCOUNTER — Other Ambulatory Visit: Payer: Self-pay | Admitting: Physician Assistant

## 2019-07-04 DIAGNOSIS — G894 Chronic pain syndrome: Secondary | ICD-10-CM

## 2019-07-04 DIAGNOSIS — M961 Postlaminectomy syndrome, not elsewhere classified: Secondary | ICD-10-CM

## 2019-07-10 ENCOUNTER — Encounter: Payer: Self-pay | Admitting: Physician Assistant

## 2019-07-10 DIAGNOSIS — F1390 Sedative, hypnotic, or anxiolytic use, unspecified, uncomplicated: Secondary | ICD-10-CM

## 2019-07-31 ENCOUNTER — Other Ambulatory Visit: Payer: Self-pay

## 2019-07-31 ENCOUNTER — Ambulatory Visit (INDEPENDENT_AMBULATORY_CARE_PROVIDER_SITE_OTHER): Payer: BLUE CROSS/BLUE SHIELD | Admitting: Sports Medicine

## 2019-07-31 DIAGNOSIS — M25561 Pain in right knee: Secondary | ICD-10-CM

## 2019-07-31 DIAGNOSIS — M961 Postlaminectomy syndrome, not elsewhere classified: Secondary | ICD-10-CM

## 2019-07-31 DIAGNOSIS — M25461 Effusion, right knee: Secondary | ICD-10-CM | POA: Diagnosis not present

## 2019-07-31 NOTE — Assessment & Plan Note (Signed)
Andrew Bean returns, he is a pleasant 44 year old male, he has chronic knee pain, MRI showed osteoarthritis and maceration of his meniscus. He had a good response to injection back in February, now requesting a second. Injected today, return as needed.

## 2019-07-31 NOTE — Assessment & Plan Note (Addendum)
Shail also has complex low back pain, postlaminectomy syndrome. He has an L3-L5 fusion, historically in imaging the L3-L5 fusion looks uncomplicated. He did have a new disc protrusion at the L5-S1 level on an MRI from earlier this year. He also has multilevel facet arthropathy, and did well after multilevel facet radiofrequency ablation. This helped his low back and posterior thigh pain significantly. He also had some pain going down to the groin, and adjacent level disease at the top of the fusion construct at the L2-L3 level, he responded well to an L2-L3 epidural. Today he is having more facet mediated pain and probably needs a repeat radiofrequency ablation. I would like to refer him back to Dr. Francesco Runner for this. Otherwise he is doing well with his current Lyrica dosing, and is significantly better in terms of pain and function than when I first met him. Of note he did mention stool incontinence, he has been having these for several months now, and was having this symptom at the time of his MRI in March 2021, I do not see any findings on his MRI that would result in urine or stool incontinence so he needs to follow-up with his PCP for this.

## 2019-07-31 NOTE — Progress Notes (Addendum)
° ° °  Procedures performed today:    Procedure: Real-time Ultrasound Guided injection of the right knee Device: Samsung HS60  Verbal informed consent obtained.  Time-out conducted.  Noted no overlying erythema, induration, or other signs of local infection.  Skin prepped in a sterile fashion.  Local anesthesia: Topical Ethyl chloride.  With sterile technique and under real time ultrasound guidance: 1 cc Kenalog 40, 2 cc lidocaine, 2 cc bupivacaine injected easily Completed without difficulty  Pain immediately resolved suggesting accurate placement of the medication.  Advised to call if fevers/chills, erythema, induration, drainage, or persistent bleeding.  Images permanently stored and available for review in the ultrasound unit.  Impression: Technically successful ultrasound guided injection.  Independent interpretation of notes and tests performed by another provider:   None.  Brief History, Exam, Impression, and Recommendations:    Pain and swelling of right knee Andrew Bean returns, he is a pleasant 44 year old male, he has chronic knee pain, MRI showed osteoarthritis and maceration of his meniscus. He had a good response to injection back in February, now requesting a second. Injected today, return as needed.  Lumbar post-laminectomy syndrome Graham also has complex low back pain, postlaminectomy syndrome. He has an L3-L5 fusion, historically in imaging the L3-L5 fusion looks uncomplicated. He did have a new disc protrusion at the L5-S1 level on an MRI from earlier this year. He also has multilevel facet arthropathy, and did well after multilevel facet radiofrequency ablation. This helped his low back and posterior thigh pain significantly. He also had some pain going down to the groin, and adjacent level disease at the top of the fusion construct at the L2-L3 level, he responded well to an L2-L3 epidural. Today he is having more facet mediated pain and probably needs a repeat  radiofrequency ablation. I would like to refer him back to Dr. Francesco Runner for this. Otherwise he is doing well with his current Lyrica dosing, and is significantly better in terms of pain and function than when I first met him. Of note he did mention stool incontinence, he has been having these for several months now, and was having this symptom at the time of his MRI in March 2021, I do not see any findings on his MRI that would result in urine or stool incontinence so he needs to follow-up with his PCP for this.    ___________________________________________ Gwen Her. Dianah Field, M.D., ABFM., CAQSM. Primary Care and [place] Instructor of [employer] [employer] [facility] [employer]

## 2019-08-07 ENCOUNTER — Other Ambulatory Visit: Payer: Self-pay | Admitting: Physician Assistant

## 2019-08-07 DIAGNOSIS — G894 Chronic pain syndrome: Secondary | ICD-10-CM

## 2019-08-08 NOTE — Telephone Encounter (Signed)
Should he still be on this?

## 2019-08-10 ENCOUNTER — Encounter: Payer: Self-pay | Admitting: Physician Assistant

## 2019-08-10 DIAGNOSIS — F322 Major depressive disorder, single episode, severe without psychotic features: Secondary | ICD-10-CM

## 2019-08-10 NOTE — Telephone Encounter (Signed)
Mental health referral pended.

## 2019-08-30 NOTE — Telephone Encounter (Signed)
Andrew Bean- CMA had me to send this referral to [facility] Per pt request and I have received a fax from them stating that they are not taking new patients outside of [facility]. Would they like for [place] to send this to [facility].

## 2019-09-20 ENCOUNTER — Other Ambulatory Visit: Payer: Self-pay | Admitting: Physician Assistant

## 2019-09-20 DIAGNOSIS — G894 Chronic pain syndrome: Secondary | ICD-10-CM

## 2019-09-21 NOTE — Telephone Encounter (Signed)
Last filled 08/08/2019 #90 no refills Last appt 07/31/2019 with Dr. Karie Schwalbe 01/10/2019 with Lesly Rubenstein  Please advise

## 2019-10-02 ENCOUNTER — Other Ambulatory Visit: Payer: Self-pay | Admitting: Physician Assistant

## 2019-10-02 NOTE — Telephone Encounter (Signed)
Please sign denial. Notes in chart from wife state mood swings/violent behavior with medication.

## 2019-11-17 ENCOUNTER — Other Ambulatory Visit: Payer: Self-pay | Admitting: Physician Assistant

## 2019-11-17 DIAGNOSIS — G4700 Insomnia, unspecified: Secondary | ICD-10-CM

## 2019-12-18 ENCOUNTER — Other Ambulatory Visit: Payer: Self-pay | Admitting: Physician Assistant

## 2019-12-18 DIAGNOSIS — G4700 Insomnia, unspecified: Secondary | ICD-10-CM

## 2019-12-20 ENCOUNTER — Other Ambulatory Visit: Payer: Self-pay | Admitting: Physician Assistant

## 2019-12-20 DIAGNOSIS — G4700 Insomnia, unspecified: Secondary | ICD-10-CM

## 2020-03-14 ENCOUNTER — Other Ambulatory Visit: Payer: Self-pay | Admitting: Physician Assistant

## 2020-03-14 DIAGNOSIS — G894 Chronic pain syndrome: Secondary | ICD-10-CM

## 2020-03-14 NOTE — Telephone Encounter (Signed)
Please review, has seen Dr. Laurian Brim about pain and states patient should not be using muscle relaxers due to behavioral changes This was last prescribed in August.

## 2020-03-21 IMAGING — XA Imaging study
2 series · 2 of 2 positions shown · non-contrast
Comparison: none

CLINICAL DATA: Lumbosacral spondylosis without myelopathy. Chronic
low back pain radiating down the left leg. Prior L3-L5 fusion. L5-S1
interlaminar epidural steroid injection requested.

[Series 1: ortho standard · 1 of 1 slices shown (1 of 2)]
[im 1/1]
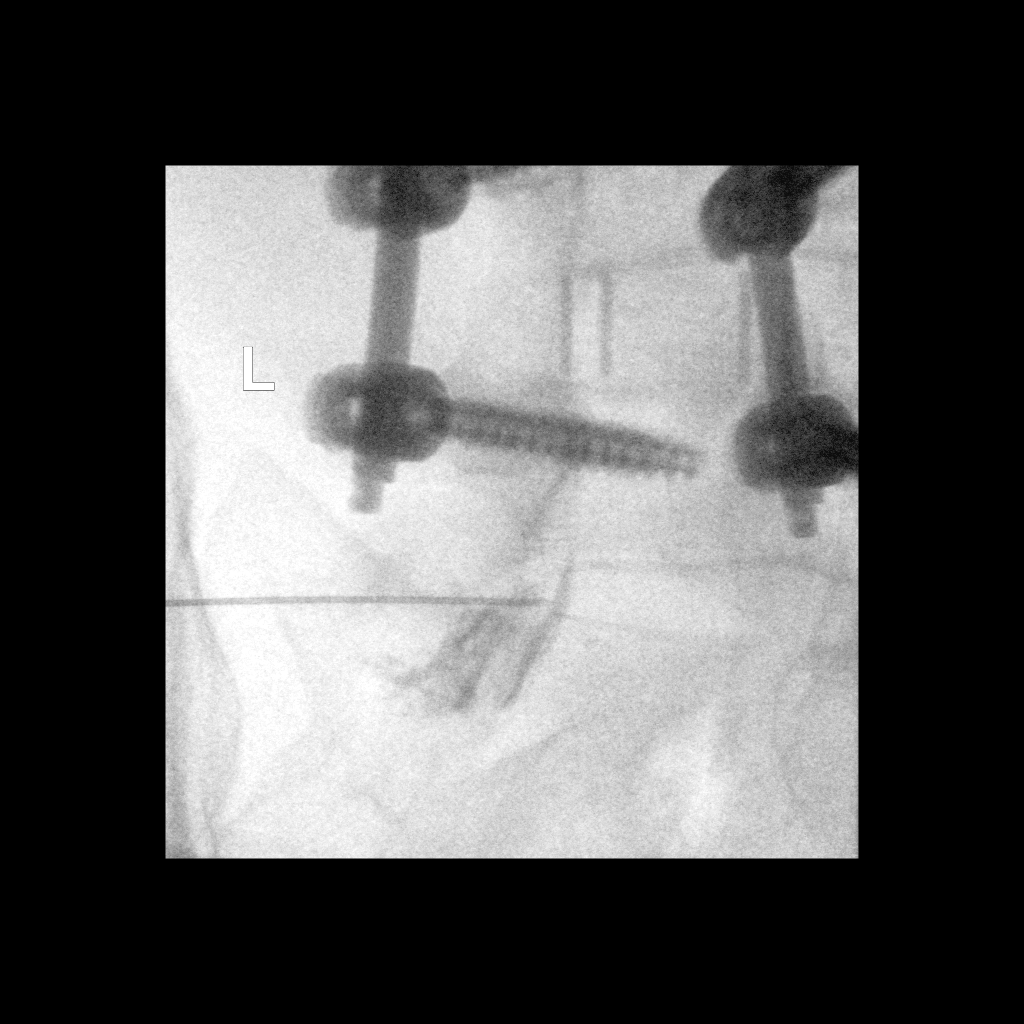

[Series 2: ortho standard · 1 of 1 slices shown (2 of 2)]
[im 1/1]
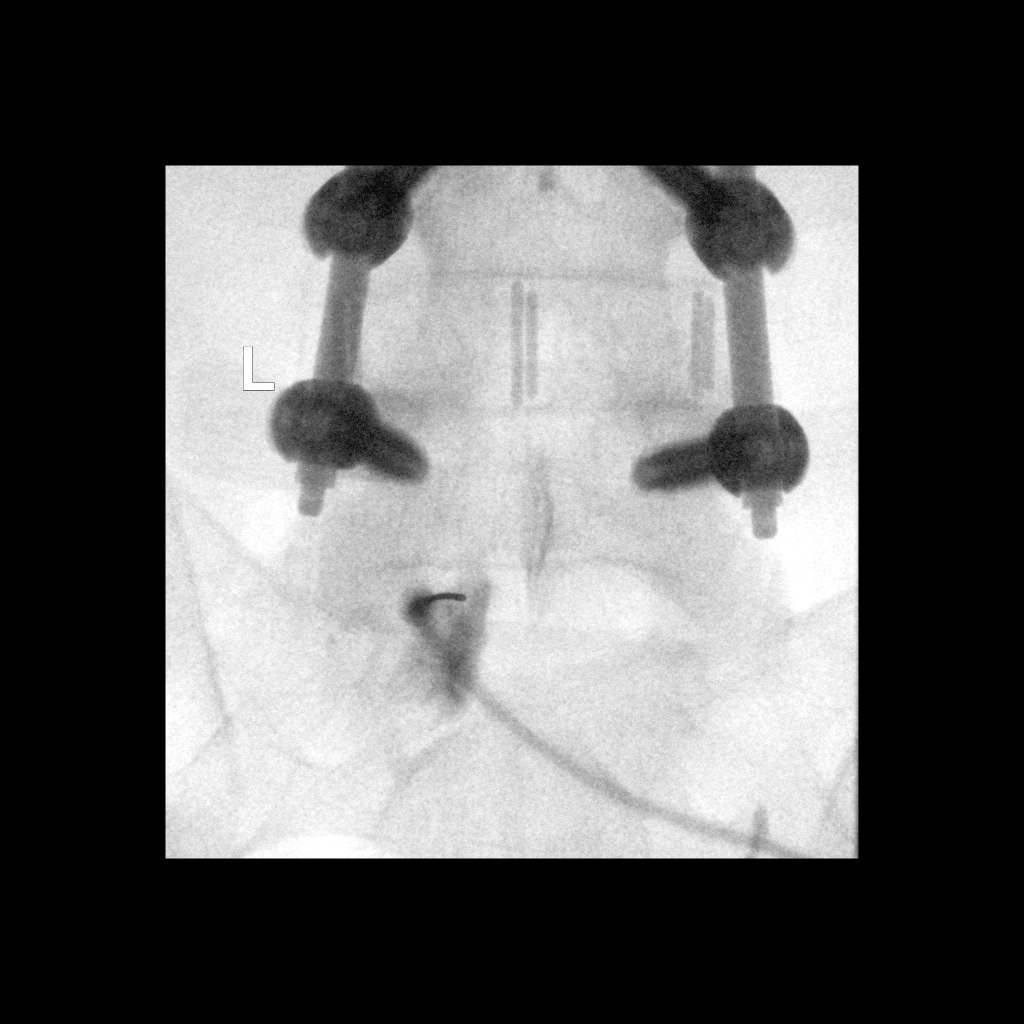

[2 of 2 positions shown; findings below may reference images not displayed]

FLUOROSCOPY TIME:  Radiation Exposure Index (as provided by the
fluoroscopic device): 2.3 mg

Number of Acquired Images:  0

PROCEDURE:
The procedure, risks, benefits, and alternatives were explained to
the patient. Questions regarding the procedure were encouraged and
answered. The patient understands and consents to the procedure.

LUMBAR EPIDURAL INJECTION:

An interlaminar approach was performed on the left at L5-S1. The
overlying skin was cleansed and anesthetized. A 3.5 inch 20 gauge
epidural needle was advanced using loss-of-resistance technique.

DIAGNOSTIC EPIDURAL INJECTION:

Injection of Isovue-M 200 shows a good epidural pattern with spread
above and below the level of needle placement, primarily on the
left. No vascular opacification is seen.

THERAPEUTIC EPIDURAL INJECTION:

120 mg of Depo-Medrol mixed with 3 mL of 1% lidocaine were
instilled. The procedure was well-tolerated, and the patient was
discharged thirty minutes following the injection in good condition.

COMPLICATIONS:
None immediate.
IMPRESSION: Technically successful epidural injection on the left at L5-S1 #1.

## 2020-05-08 ENCOUNTER — Encounter: Payer: Self-pay | Admitting: Physician Assistant

## 2020-05-08 ENCOUNTER — Other Ambulatory Visit: Payer: Self-pay

## 2020-05-08 ENCOUNTER — Ambulatory Visit (INDEPENDENT_AMBULATORY_CARE_PROVIDER_SITE_OTHER): Payer: Medicare Other

## 2020-05-08 ENCOUNTER — Ambulatory Visit (INDEPENDENT_AMBULATORY_CARE_PROVIDER_SITE_OTHER): Payer: Medicare Other | Admitting: Physician Assistant

## 2020-05-08 VITALS — BP 147/96 | HR 92 | Ht 72.0 in | Wt 206.0 lb

## 2020-05-08 DIAGNOSIS — M961 Postlaminectomy syndrome, not elsewhere classified: Secondary | ICD-10-CM | POA: Diagnosis not present

## 2020-05-08 DIAGNOSIS — I10 Essential (primary) hypertension: Secondary | ICD-10-CM

## 2020-05-08 DIAGNOSIS — F322 Major depressive disorder, single episode, severe without psychotic features: Secondary | ICD-10-CM

## 2020-05-08 DIAGNOSIS — R2241 Localized swelling, mass and lump, right lower limb: Secondary | ICD-10-CM | POA: Diagnosis not present

## 2020-05-08 DIAGNOSIS — G894 Chronic pain syndrome: Secondary | ICD-10-CM | POA: Diagnosis not present

## 2020-05-08 DIAGNOSIS — R2231 Localized swelling, mass and lump, right upper limb: Secondary | ICD-10-CM

## 2020-05-08 DIAGNOSIS — E785 Hyperlipidemia, unspecified: Secondary | ICD-10-CM | POA: Diagnosis not present

## 2020-05-08 DIAGNOSIS — F321 Major depressive disorder, single episode, moderate: Secondary | ICD-10-CM

## 2020-05-08 DIAGNOSIS — F419 Anxiety disorder, unspecified: Secondary | ICD-10-CM

## 2020-05-08 DIAGNOSIS — Z9181 History of falling: Secondary | ICD-10-CM | POA: Diagnosis not present

## 2020-05-08 MED ORDER — LISINOPRIL 5 MG PO TABS: 5.0000 mg | ORAL_TABLET | Freq: Every day | ORAL | 0 refills | Status: DC

## 2020-05-08 MED ORDER — PITAVASTATIN CALCIUM 2 MG PO TABS: 1.0000 | ORAL_TABLET | Freq: Every day | ORAL | 3 refills | Status: DC

## 2020-05-08 NOTE — Progress Notes (Signed)
Subjective:    Patient ID: Andrew Bean, male    DOB: [birth year], 45 y.o.   MRN: [id number]  HPI  Patient is a 45 year old male with chronic pain syndrome due to low back pain.  He has had previous surgery continues to have pain.  He is followed by Dr. Benjamin Bean sports medicine and Dr. Laurian Bean orthopedics.  He is having some problems with falling in the shower.  He was requesting some rails to be placed in his shower.  He would like a referral.  Most the time he is walking with a cane.  It is very hard for him to use a cane in the shower.  He requests a DMV form so he can park closer.  He is off all medications currently right now his blood pressure remains elevated.  He is willing to restart medication.  He would like to try lisinopril again.  He thinks it was the Lyrica that was causing all the problems.  He denies any chest pain, palpitations, headaches or vision changes.  He would like to restart Livalo for his cholesterol.  Patient has noticed a mobile not in his right thumb.  Hurts if pressed really hard.  No overall pain.  Does not remember any injury.  Seems to be getting a little bigger.  Wonders what this is.  .. Active Ambulatory Problems    Diagnosis Date Noted  . Elevated blood pressure 09/03/2015  . Tobacco dependence 09/03/2015  . Lumbar post-laminectomy syndrome 09/03/2015  . Insomnia 09/03/2015  . Restless leg syndrome, uncontrolled 09/03/2015  . Essential hypertension, benign 09/17/2015  . Depression, major, single episode, moderate (HCC) 02/14/2017  . Dyslipidemia 03/04/2017  . Bursitis of intermetatarsal bursa of left foot 02/28/2018  . Chronic pain syndrome 03/02/2018  . Metatarsalgia of left foot 11/02/2018  . Scrotal pain 01/10/2019  . Pain and swelling of right knee 03/28/2019  . Benzodiazepine misuse 07/10/2019   Resolved Ambulatory Problems    Diagnosis Date Noted  . Radiculopathy of lumbar region 08/19/2010  . Spinal stenosis at L4-L5 level  05/24/2014  . Degenerative disc disease, lumbar 01/10/2015  . Cramp of both lower extremities 09/03/2015   Past Medical History:  Diagnosis Date  . Headache   . Hyperlipemia   . Hypertension      Review of Systems  All other systems reviewed and are negative.      Objective:   Physical Exam Vitals reviewed.  Constitutional:      Appearance: Normal appearance.  HENT:     Head: Normocephalic.  Cardiovascular:     Rate and Rhythm: Normal rate and regular rhythm.     Pulses: Normal pulses.  Musculoskeletal:     Comments: Right distal thumb flesh colored firm mobile nodule. No erythema, warmth. Some tenderness assoicated.   Neurological:     General: No focal deficit present.     Mental Status: He is alert and oriented to person, place, and time.          .. Depression screen [facility] 2/9 05/08/2020 01/10/2019 04/11/2018 02/28/2018 11/02/2017  Decreased Interest 3 1 3 3 2   Down, Depressed, Hopeless 3 3 2 2 3   PHQ - 2 Score 6 4 5 5 5   Altered sleeping 3 3 3 3 3   Tired, decreased energy 3 2 3 3 3   Change in appetite 3 1 3 2 1   Feeling bad or failure about yourself  3 1 2 2 3   Trouble concentrating 3 1 2 2 3   Moving  slowly or fidgety/restless 3 0 0 0 1  Suicidal thoughts 2 1 1  0 0  PHQ-9 Score 26 13 19 17 19   Difficult doing work/chores Extremely dIfficult Very difficult Very difficult Very difficult Very difficult   . GAD 7 : Generalized Anxiety Score 05/08/2020 01/10/2019 04/11/2018 02/28/2018  Nervous, Anxious, on Edge 3 2 1  0  Control/stop worrying 3 2 3 3   Worry too much - different things 3 2 3 2   Trouble relaxing 3 2 3 2   Restless 3 1 2  0  Easily annoyed or irritable 3 3 3 3   Afraid - awful might happen 3 1 2 3   Total GAD 7 Score 21 13 17 13   Anxiety Difficulty Extremely difficult Very difficult Very difficult Very difficult     Assessment & Plan:  .5/9/2020[name] was seen today for hypertension.  Diagnoses and all orders for this visit:  Essential hypertension,  benign -     lisinopril (ZESTRIL) 5 MG tablet; Take 1 tablet (5 mg total) by mouth daily.  Dyslipidemia -     Pitavastatin Calcium 2 MG TABS; Take 1 tablet (2 mg total) by mouth daily.  Mass of skin of right thumb -     DG Finger Thumb Right  Chronic pain syndrome  Depression, major, single episode, moderate (HCC)  Lumbar post-laminectomy syndrome   BP increased. Start lisinopril. Follow up in 1 month. Elevation could be due to chronic pain, alcohol, and smoking. Not willing to quit smoking or drinking.   Refilled livalo for cholesterol.   DMV placard filled out. Will make referral for home health assessment for handle bars in shower.  Continue follow up with Dr. and Dr. .   PHQ9 and GAD-7 scores are elevated. Pain states due to pain. He is scheduled to have another procedure done on back soon and hoping will help with mood. Does not want to try any medication or counseling. Hx of benzo misuse.   High fall risk due to ongoing back pain and need for cane. Falling in shower some. Needs hand rails in shower to help decrease fall risk.   Unclear etiology of mass of right DIP. Will get xray to confirm to foreign body.

## 2020-05-10 ENCOUNTER — Other Ambulatory Visit: Payer: Self-pay

## 2020-05-10 ENCOUNTER — Ambulatory Visit (INDEPENDENT_AMBULATORY_CARE_PROVIDER_SITE_OTHER): Payer: Medicare Other | Admitting: Sports Medicine

## 2020-05-10 DIAGNOSIS — M170 Bilateral primary osteoarthritis of knee: Secondary | ICD-10-CM | POA: Diagnosis not present

## 2020-05-10 DIAGNOSIS — L723 Sebaceous cyst: Secondary | ICD-10-CM | POA: Diagnosis not present

## 2020-05-10 NOTE — Assessment & Plan Note (Signed)
This is a pleasant 45 year old male, he has bilateral knee osteoarthritis with meniscal maceration as evidenced by an MRI of his right knee. Last injection was in June 2021, repeat bilateral unguided knee injections today. Return to see me as needed.

## 2020-05-10 NOTE — Progress Notes (Signed)
Tiran,   No bone abnormality.  No foreign body.  Seems to represent a soft tissue cyst or calcification. If it is not bothering you I would not do anything. If it is bothering you I would mention this to dr T and let him ultrasound at next appt and see if he could inject with steroid etc.

## 2020-05-10 NOTE — Progress Notes (Signed)
    Procedures performed today:    Procedure:  Injection of left knee Consent obtained and verified. Time-out conducted. Noted no overlying erythema, induration, or other signs of local infection. Skin prepped in a sterile fashion. Topical analgesic spray: Ethyl chloride. Completed without difficulty. Meds: 1 cc kenalog 40, 2 cc lidocaine, 2 cc bupivacaine injected easily. Advised to call if fevers/chills, erythema, induration, drainage, or persistent bleeding.  Procedure:  Injection of right knee Consent obtained and verified. Time-out conducted. Noted no overlying erythema, induration, or other signs of local infection. Skin prepped in a sterile fashion. Topical analgesic spray: Ethyl chloride. Completed without difficulty. Meds: 1 cc kenalog 40, 2 cc lidocaine, 2 cc bupivacaine injected easily. Advised to call if fevers/chills, erythema, induration, drainage, or persistent bleeding.  Independent interpretation of notes and tests performed by another provider:   None.  Brief History, Exam, Impression, and Recommendations:    Primary osteoarthritis of both knees This is a pleasant 45 year old male, he has bilateral knee osteoarthritis with meniscal maceration as evidenced by an MRI of his right knee. Last injection was in June 2021, repeat bilateral unguided knee injections today. Return to see me as needed.  Sebaceous cyst of right thumb There does appear to be a minimally symptomatic sebaceous cyst on the volar aspect of the right thumb pad, I have advised him to just leave this alone for now but if it becomes symptomatic to the point where he cannot live with it I am happy to do a surgical excision here in the office.    ___________________________________________ [name]. [name], M.D., ABFM., CAQSM. Primary Care and Sports Medicine [facility]  Adjunct Instructor of Family Medicine  [employer]

## 2020-05-10 NOTE — Assessment & Plan Note (Signed)
There does appear to be a minimally symptomatic sebaceous cyst on the volar aspect of the right thumb pad, I have advised him to just leave this alone for now but if it becomes symptomatic to the point where he cannot live with it I am happy to do a surgical excision here in the office.

## 2020-05-13 DIAGNOSIS — F322 Major depressive disorder, single episode, severe without psychotic features: Secondary | ICD-10-CM | POA: Insufficient documentation

## 2020-05-13 DIAGNOSIS — F419 Anxiety disorder, unspecified: Secondary | ICD-10-CM | POA: Insufficient documentation

## 2020-05-13 DIAGNOSIS — Z9181 History of falling: Secondary | ICD-10-CM | POA: Insufficient documentation

## 2020-05-13 NOTE — Telephone Encounter (Signed)
Fall risk high due to chronic low back pain. In shower cannot use cane. He is wanting shower handles placed in shower. How do we make that type of referral?

## 2020-05-13 NOTE — Telephone Encounter (Signed)
House. Can you come show me?

## 2020-05-15 ENCOUNTER — Other Ambulatory Visit: Payer: Self-pay | Admitting: Physician Assistant

## 2020-05-15 MED ORDER — AMBULATORY NON FORMULARY MEDICATION: 0 refills | Status: AC

## 2020-05-15 NOTE — Telephone Encounter (Signed)
Prescription printed and signed

## 2020-05-15 NOTE — Progress Notes (Signed)
Needs handrail for help with balance in shower due to low back pain that is chronic.

## 2020-05-27 ENCOUNTER — Encounter: Payer: Self-pay | Admitting: Physician Assistant

## 2020-05-30 NOTE — Telephone Encounter (Signed)
I think a reaction knee brace would be fine for him.

## 2020-06-04 DIAGNOSIS — M5459 Other low back pain: Secondary | ICD-10-CM | POA: Diagnosis not present

## 2020-06-04 DIAGNOSIS — M47816 Spondylosis without myelopathy or radiculopathy, lumbar region: Secondary | ICD-10-CM | POA: Diagnosis not present

## 2020-07-18 DIAGNOSIS — G894 Chronic pain syndrome: Secondary | ICD-10-CM | POA: Diagnosis not present

## 2020-07-18 DIAGNOSIS — M961 Postlaminectomy syndrome, not elsewhere classified: Secondary | ICD-10-CM | POA: Diagnosis not present

## 2020-07-18 DIAGNOSIS — M5136 Other intervertebral disc degeneration, lumbar region: Secondary | ICD-10-CM | POA: Diagnosis not present

## 2020-07-18 DIAGNOSIS — M47816 Spondylosis without myelopathy or radiculopathy, lumbar region: Secondary | ICD-10-CM | POA: Diagnosis not present

## 2020-07-29 DIAGNOSIS — M5136 Other intervertebral disc degeneration, lumbar region: Secondary | ICD-10-CM | POA: Diagnosis not present

## 2020-07-29 DIAGNOSIS — M47816 Spondylosis without myelopathy or radiculopathy, lumbar region: Secondary | ICD-10-CM | POA: Diagnosis not present

## 2020-08-11 ENCOUNTER — Other Ambulatory Visit: Payer: Self-pay | Admitting: Physician Assistant

## 2020-08-11 DIAGNOSIS — I10 Essential (primary) hypertension: Secondary | ICD-10-CM

## 2020-09-18 DIAGNOSIS — M7918 Myalgia, other site: Secondary | ICD-10-CM | POA: Diagnosis not present

## 2020-09-18 DIAGNOSIS — M48061 Spinal stenosis, lumbar region without neurogenic claudication: Secondary | ICD-10-CM | POA: Diagnosis not present

## 2020-09-18 DIAGNOSIS — G894 Chronic pain syndrome: Secondary | ICD-10-CM | POA: Diagnosis not present

## 2020-09-18 DIAGNOSIS — M961 Postlaminectomy syndrome, not elsewhere classified: Secondary | ICD-10-CM | POA: Diagnosis not present

## 2020-09-18 DIAGNOSIS — M47816 Spondylosis without myelopathy or radiculopathy, lumbar region: Secondary | ICD-10-CM | POA: Diagnosis not present

## 2020-09-18 DIAGNOSIS — M5136 Other intervertebral disc degeneration, lumbar region: Secondary | ICD-10-CM | POA: Diagnosis not present

## 2020-09-18 DIAGNOSIS — M5459 Other low back pain: Secondary | ICD-10-CM | POA: Diagnosis not present

## 2020-09-18 DIAGNOSIS — G8929 Other chronic pain: Secondary | ICD-10-CM | POA: Diagnosis not present

## 2020-10-04 ENCOUNTER — Other Ambulatory Visit: Payer: Self-pay

## 2020-10-04 ENCOUNTER — Ambulatory Visit (INDEPENDENT_AMBULATORY_CARE_PROVIDER_SITE_OTHER): Payer: Medicare Other | Admitting: Sports Medicine

## 2020-10-04 DIAGNOSIS — M170 Bilateral primary osteoarthritis of knee: Secondary | ICD-10-CM

## 2020-10-04 DIAGNOSIS — L723 Sebaceous cyst: Secondary | ICD-10-CM | POA: Diagnosis not present

## 2020-10-04 NOTE — Patient Instructions (Signed)
Incision Care, Adult ?An incision is a surgical cut that is made through your skin. Most incisions are closed after a surgical procedure. Your incision may be closed with stitches (sutures), staples, skin glue, or adhesive strips. You may need to return to your health care provider to have sutures or staples removed. This may occur several days or several weeks after your surgery. Until then, the incision needs to be cared for properly to prevent infection. Follow instructions from your health care provider about how to care for your incision. ?Supplies needed: ?Soap, water, and a clean hand towel. ?Wound cleanser. ?A clean bandage (dressing), if needed. ?Cream or ointment, if told by your health care provider. ?Clean gauze. ?How to care for your incision ?Cleaning the incision ?Ask your health care provider how to clean the incision. This may include: ?Wearing medical gloves. ?Using mild soap and water, or wound cleanser. ?Using a clean gauze to pat the incision dry after cleaning it. ?Dressing changes ?Wash your hands with soap and water for at least 20 seconds before and after you change the dressing. If soap and water are not available, use hand sanitizer. ?Do not use disinfectants or antiseptics, such as rubbing alcohol, to clean open wounds unless told by your health care provider. ?Change your dressing as told by your health care provider. ?Leave sutures, staples, skin glue, or adhesive strips in place. These skin closures may need to stay in place for 2 weeks or longer. If adhesive strip edges start to loosen and curl up, you may trim the loose edges. Do not remove adhesive strips completely unless your health care provider tells you to do that. ?Apply cream or ointment. Do this only as told by your health care provider. ?Cover the incision with a clean dressing. Ask your health care provider when you can start to leave the incision uncovered. ?Checking for infection ?Check your incision area every day for  signs of infection. Check for: ?More redness, swelling, or pain. ?More fluid or blood. ?New warmth, hardness, or a rash that develops along the incision. ?Pus or a bad smell. ? ?Follow these instructions at home ?Medicines ?Take over-the-counter and prescription medicines only as told by your health care provider. ?If you were prescribed an antibiotic medicine, cream, or ointment, take or apply it as told by your health care provider. Do not stop using the antibiotic even if your condition improves. ?Eating and drinking ?Eat a diet that includes protein, vitamin A, vitamin C, and other nutrient-rich foods to help the wound heal. ?Foods rich in protein include meat, fish, eggs, dairy, beans, nuts, and protein supplement drinks. ?Foods rich in vitamin A include carrots and dark green, leafy vegetables. ?Foods rich in vitamin C include citrus fruits, tomatoes, broccoli, and peppers. ?Drink enough fluid to keep your urine pale yellow. ?General instructions ? ?Do not take baths, swim, use a hot tub, or do anything that would put the incision underwater until your health care provider approves. Ask your health care provider if you may take showers. You may only be allowed to take sponge baths. ?Limit movement around your incision to promote healing. ?Avoid straining, lifting, or exercising for the first 2 weeks after your procedure, or for as long as told by your health care provider. ?Return to your normal activities as told by your health care provider. Ask your health care provider what activities are safe for you. ?Do not scratch, pick, or scrub the incision. Keep it covered as told by your health care provider. ?  Protect your incision from the sun when you are outside for the first 6 months, or for as long as told by your health care provider. Cover up the scar area or apply sunscreen that has an SPF of at least 30. ?Do not use any products that contain nicotine or tobacco, such as cigarettes, e-cigarettes, and  chewing tobacco. These can delay incision healing. If you need help quitting, ask your health care provider. ?Keep all follow-up visits. This is important. ?Contact a health care provider if: ?You have any of these signs of infection: ?More redness, swelling, or pain around your incision. ?More fluid or blood coming from your incision. ?New warmth or hardness around your incision. ?Pus or a bad smell coming from your incision. ?A rash that develops along the incision. ?You feel nauseous or you vomit. ?You are dizzy. ?Your sutures, staples, skin glue, or adhesive strips come undone. ?Your wound gets bigger. ?You have a fever. ?Get help right away if: ?Your incision bleeds through the dressing and the bleeding does not stop with gentle pressure. ?The edges of your incision open up and separate. ?These symptoms may represent a serious problem that is an emergency. Do not wait to see if the symptoms will go away. Get medical help right away. Call your local emergency services (911 in the [place] Do not drive yourself to the hospital. ?Summary ?Follow instructions from your health care provider about how to care for your incision. ?Wash your hands with soap and water for at least 20 seconds before and after you change the dressing. If soap and water are not available, use hand sanitizer. ?Check your incision area every day for signs of infection. ?Keep all follow-up visits. This is important. ?This information is not intended to replace advice given to you by your health care provider. Make sure you discuss any questions you have with your health care provider. ?Document Revised: 04/22/2020 Document Reviewed: 04/22/2020 ?Elsevier Patient Education ? 2022 Elsevier Inc. ? ?

## 2020-10-04 NOTE — Progress Notes (Addendum)
    Procedures performed today:    Procedure:  Excision of right thumb 1.1 cm subcutaneous mass Risks, benefits, and alternatives explained and consent obtained. Time out conducted. Surface prepped with alcohol. 5cc lidocaine with epinephine infiltrated in a field block. Adequate anesthesia ensured. Area prepped and draped in a sterile fashion. Excision performed with: Using a #15 blade I made a linear incision over the thumb pad, then using blunt dissection I exposed the mass which appeared to be a subcutaneous cyst, this was removed en bloc, I then closed the incision with a single horizontal mattress 4-0 Ethilon suture. Hemostasis achieved. Pt stable.  Independent interpretation of notes and tests performed by another provider:   None.  Brief History, Exam, Impression, and Recommendations:    Sebaceous cyst of right thumb Today I performed a surgical excision of a sebaceous cyst in the right thumb, I placed a single horizontal mattress 4-0 Ethilon suture, return to see me in 10 days for suture removal.  Primary osteoarthritis of both knees Naseer has bilateral knee osteoarthritis, he has joint line pain, mild effusions, crepitus with motion, and mild joint instability, he also has mild weakness to extension and flexion. For insurance purposes: The knee was swollen to inspection, good range of motion with crepitus, mild instability to valgus and varus stress. Kyle would benefit from bilateral knee braces/orthoses, this will be for the purpose of increasing stability of the joint (documented for insurance purposes). Patient desires bilateral knee braces, we will use reaction braces, return as needed. Of note the patient is ambulatory.    ___________________________________________ Andrew Bean. [name], M.D., ABFM., CAQSM. Primary Care and Sports Medicine [facility]  Adjunct Instructor of Family Medicine  [employer]

## 2020-10-04 NOTE — Assessment & Plan Note (Addendum)
Andrew Bean has bilateral knee osteoarthritis, he has joint line pain, mild effusions, crepitus with motion, and mild joint instability, he also has mild weakness to extension and flexion. For insurance purposes: The knee was swollen to inspection, good range of motion with crepitus, mild instability to valgus and varus stress. Andrew Bean would benefit from bilateral knee braces/orthoses, this will be for the purpose of increasing stability of the joint (documented for insurance purposes). Patient desires bilateral knee braces, we will use reaction braces, return as needed. Of note the patient is ambulatory.

## 2020-10-04 NOTE — Assessment & Plan Note (Signed)
Today I performed a surgical excision of a sebaceous cyst in the right thumb, I placed a single horizontal mattress 4-0 Ethilon suture, return to see me in 10 days for suture removal.

## 2020-10-10 DIAGNOSIS — M48061 Spinal stenosis, lumbar region without neurogenic claudication: Secondary | ICD-10-CM | POA: Diagnosis not present

## 2020-10-10 DIAGNOSIS — M5116 Intervertebral disc disorders with radiculopathy, lumbar region: Secondary | ICD-10-CM | POA: Diagnosis not present

## 2020-10-10 DIAGNOSIS — Z981 Arthrodesis status: Secondary | ICD-10-CM | POA: Diagnosis not present

## 2020-10-10 DIAGNOSIS — M4726 Other spondylosis with radiculopathy, lumbar region: Secondary | ICD-10-CM | POA: Diagnosis not present

## 2020-10-10 DIAGNOSIS — M4727 Other spondylosis with radiculopathy, lumbosacral region: Secondary | ICD-10-CM | POA: Diagnosis not present

## 2020-10-10 DIAGNOSIS — M5117 Intervertebral disc disorders with radiculopathy, lumbosacral region: Secondary | ICD-10-CM | POA: Diagnosis not present

## 2020-10-14 ENCOUNTER — Encounter: Payer: Self-pay | Admitting: Sports Medicine

## 2020-10-14 ENCOUNTER — Ambulatory Visit (INDEPENDENT_AMBULATORY_CARE_PROVIDER_SITE_OTHER): Payer: Medicare Other | Admitting: Sports Medicine

## 2020-10-14 DIAGNOSIS — L723 Sebaceous cyst: Secondary | ICD-10-CM

## 2020-10-14 NOTE — Progress Notes (Signed)
    Procedures performed today:    None.  Independent interpretation of notes and tests performed by another provider:   None.  Brief History, Exam, Impression, and Recommendations:    Sebaceous cyst of right thumb Lavoris returns, he is 10 days post surgical excision of a sebaceous cyst from the right thumb, I removed with a single horizontal mattress Ethilon suture today, we applied some Dermabond, he can return as needed. Incision looks great, clean, dry, intact.    ___________________________________________ [name]. [name], M.D., ABFM., CAQSM. Primary Care and Sports Medicine [facility]  Adjunct Instructor of Family Medicine  [employer]

## 2020-10-14 NOTE — Assessment & Plan Note (Signed)
Andrew Bean returns, he is 10 days post surgical excision of a sebaceous cyst from the right thumb, I removed with a single horizontal mattress Ethilon suture today, we applied some Dermabond, he can return as needed. Incision looks great, clean, dry, intact.

## 2020-10-16 DIAGNOSIS — M5459 Other low back pain: Secondary | ICD-10-CM | POA: Diagnosis not present

## 2020-10-16 DIAGNOSIS — M7918 Myalgia, other site: Secondary | ICD-10-CM | POA: Diagnosis not present

## 2020-10-16 DIAGNOSIS — M47816 Spondylosis without myelopathy or radiculopathy, lumbar region: Secondary | ICD-10-CM | POA: Diagnosis not present

## 2020-10-16 DIAGNOSIS — M5136 Other intervertebral disc degeneration, lumbar region: Secondary | ICD-10-CM | POA: Diagnosis not present

## 2020-10-16 DIAGNOSIS — G894 Chronic pain syndrome: Secondary | ICD-10-CM | POA: Diagnosis not present

## 2020-10-16 DIAGNOSIS — M961 Postlaminectomy syndrome, not elsewhere classified: Secondary | ICD-10-CM | POA: Diagnosis not present

## 2020-10-16 DIAGNOSIS — M48061 Spinal stenosis, lumbar region without neurogenic claudication: Secondary | ICD-10-CM | POA: Diagnosis not present

## 2020-10-16 DIAGNOSIS — G8929 Other chronic pain: Secondary | ICD-10-CM | POA: Diagnosis not present

## 2020-12-30 ENCOUNTER — Ambulatory Visit (INDEPENDENT_AMBULATORY_CARE_PROVIDER_SITE_OTHER): Payer: Medicare Other | Admitting: Physician Assistant

## 2020-12-30 ENCOUNTER — Encounter: Payer: Self-pay | Admitting: Physician Assistant

## 2020-12-30 ENCOUNTER — Other Ambulatory Visit: Payer: Self-pay

## 2020-12-30 ENCOUNTER — Ambulatory Visit (INDEPENDENT_AMBULATORY_CARE_PROVIDER_SITE_OTHER): Payer: Medicare Other

## 2020-12-30 VITALS — BP 143/86 | HR 84 | Temp 98.6°F | Ht 73.0 in | Wt 207.0 lb

## 2020-12-30 DIAGNOSIS — R6884 Jaw pain: Secondary | ICD-10-CM

## 2020-12-30 DIAGNOSIS — I10 Essential (primary) hypertension: Secondary | ICD-10-CM | POA: Diagnosis not present

## 2020-12-30 DIAGNOSIS — K047 Periapical abscess without sinus: Secondary | ICD-10-CM | POA: Diagnosis not present

## 2020-12-30 DIAGNOSIS — R590 Localized enlarged lymph nodes: Secondary | ICD-10-CM

## 2020-12-30 LAB — I-STAT CREATININE (MANUAL ENTRY): Creatinine, Ser: 0.7 (ref 0.50–1.10)

## 2020-12-30 MED ORDER — CEFTRIAXONE SODIUM 1 G IJ SOLR
1.0000 g | Freq: Once | INTRAMUSCULAR | Status: AC
  Administered 2020-12-30: 14:00:00 1 g via INTRAMUSCULAR

## 2020-12-30 MED ORDER — AZITHROMYCIN 250 MG PO TABS: ORAL_TABLET | ORAL | 0 refills | Status: AC

## 2020-12-30 MED ORDER — HYDROCODONE-ACETAMINOPHEN 5-325 MG PO TABS: 1.0000 | ORAL_TABLET | Freq: Four times a day (QID) | ORAL | 0 refills | Status: DC | PRN

## 2020-12-30 MED ORDER — HYDROCODONE-ACETAMINOPHEN 5-325 MG PO TABS: 1.0000 | ORAL_TABLET | Freq: Four times a day (QID) | ORAL | 0 refills | Status: AC | PRN

## 2020-12-30 MED ORDER — IBUPROFEN 800 MG PO TABS: 800.0000 mg | ORAL_TABLET | Freq: Three times a day (TID) | ORAL | 0 refills | Status: DC | PRN

## 2020-12-30 MED ORDER — LISINOPRIL 5 MG PO TABS: 5.0000 mg | ORAL_TABLET | Freq: Every day | ORAL | 1 refills | Status: DC

## 2020-12-30 MED ORDER — IOHEXOL 300 MG/ML  SOLN
75.0000 mL | Freq: Once | INTRAMUSCULAR | Status: AC | PRN
  Administered 2020-12-30: 15:00:00 75 mL via INTRAVENOUS

## 2020-12-30 NOTE — Progress Notes (Signed)
He had his tooth extraction by Dr. Ellery Plunk in [place]. Arline Asp can we call and let them know of abscess and when he can get in to have drained? Once we get that appt Chrissie Noa can call and let pt know to stay on abx and need to get in with surgeon ASAP.

## 2020-12-30 NOTE — Patient Instructions (Signed)
If no abscess needs to follow up with surgeon.  Start zpak tomorrow.   Dental Abscess A dental abscess is an infection around a tooth that may involve pain, swelling, and a collection of pus, as well as other symptoms. Treatment is important to help with symptoms and to prevent the infection from spreading. The general types of dental abscesses are: Pulpal abscess. This abscess may form from the inner part of the tooth (pulp). Periodontal abscess. This abscess may form from the gum. What are the causes? This condition is caused by a bacterial infection in or around the tooth. It may result from: Severe tooth decay (cavities). Trauma to the tooth, such as a broken or chipped tooth. What increases the risk? This condition is more likely to develop in males. It is also more likely to develop in people who: Have cavities. Have severe gum disease. Eat sugary snacks between meals. Use tobacco products. Have diabetes. Have a weakened disease-fighting system (immune system). Do not brush and care for their teeth regularly. What are the signs or symptoms? Mild symptoms of this condition include: Tenderness. Bad breath. Fever. A bitter taste in the mouth. Pain in and around the infected tooth. Moderate symptoms of this condition include: Swollen neck glands. Chills. Pus drainage. Swelling and redness around the infected tooth, in the mouth, or in the face. Severe pain in and around the infected tooth. Severe symptoms of this condition include: Difficulty swallowing. Difficulty opening the mouth. Nausea. Vomiting. How is this diagnosed? This condition is diagnosed based on: Your symptoms and your medical and dental history. An examination of the infected tooth. During the exam, your dental care provider may tap on the infected tooth. You may also need to have X-rays taken of the affected area. How is this treated? This condition is treated by getting rid of the infection. This may  be done with: Antibiotic medicines. These may be used in certain situations. Antibacterial mouth rinse. Incision and drainage. This procedure is done by making an incision in the abscess to drain out the pus. Removing pus is the first priority in treating an abscess. A root canal. This may be performed to save the tooth. Your dental care provider accesses the visible part of your tooth (crown) with a drill and removes any infected pulp. Then the space is filled and sealed off. Tooth extraction. The tooth is pulled out if it cannot be saved by other treatment. You may also receive treatment for pain, such as: Acetaminophen or NSAIDs. Gels that contain a numbing medicine. An injection to block the pain near your nerve. Follow these instructions at home: Medicines Take over-the-counter and prescription medicines only as told by your dental care provider. If you were prescribed an antibiotic, take it as told by your dental care provider. Do not stop taking the antibiotic even if you start to feel better. If you were prescribed a gel that contains a numbing medicine, use it exactly as told in the directions. Do not use these gels for children who are younger than 25 years of age. Use an antibacterial mouth rinse as told by your dental care provider. General instructions  Gargle with a mixture of salt and water 3-4 times a day or as needed. To make salt water, completely dissolve -1 tsp (3-6 g) of salt in 1 cup (237 mL) of warm water. Eat a soft diet while your abscess is healing. Drink enough fluid to keep your urine pale yellow. Do not apply heat to the outside  of your mouth. Do not use any products that contain nicotine or tobacco. These products include cigarettes, chewing tobacco, and vaping devices, such as e-cigarettes. If you need help quitting, ask your dental care provider. Keep all follow-up visits. This is important. How is this prevented?  Excellent dental home care, which includes  brushing your teeth every morning and night with fluoride toothpaste. Floss one time each day. Get regularly scheduled dental cleanings. Consider having a dental sealant applied on teeth that have deep grooves to prevent cavities. Drink fluoridated water regularly. This includes most tap water. Check the label on bottled water to see if it contains fluoride. Reduce or eliminate sugary drinks. Eat healthy meals and snacks. Wear a mouth guard or face shield to protect your teeth while playing sports. Contact a health care provider if: Your pain is worse and is not helped by medicine. You have swelling. You see pus around the tooth. You have a fever or chills. Get help right away if: Your symptoms suddenly get worse. You have a very bad headache. You have problems breathing or swallowing. You have trouble opening your mouth. You have swelling in your neck or around your eye. These symptoms may represent a serious problem that is an emergency. Do not wait to see if the symptoms will go away. Get medical help right away. Call your local emergency services (911 in the [place] Do not drive yourself to the hospital. Summary A dental abscess is a collection of pus in or around a tooth that results from an infection. A dental abscess may result from severe tooth decay, trauma to the tooth, or severe gum disease around a tooth. Symptoms include severe pain, swelling, redness, and drainage of pus in and around the infected tooth. The first priority in treating a dental abscess is to drain out the pus. Treatment may also involve removing damage inside the tooth (root canal) or extracting the tooth. This information is not intended to replace advice given to you by your health care provider. Make sure you discuss any questions you have with your health care provider. Document Revised: 03/28/2020 Document Reviewed: 03/28/2020 Elsevier Patient Education  2022 [employer].

## 2020-12-30 NOTE — Progress Notes (Signed)
Subjective:    Patient ID: Andrew Bean, male    DOB: 1975/07/07, 45 y.o.   MRN: [id number]  HPI Pt is a 45 yo male who presents to the clinic with left lower jaw and neck pain, swelling. Not able to open mouth. 2 weeks ago had right lower molar tooth removed by Dr. Ellery Bean in [place].  Placed on amoxicillin and stopped Sunday a week ago. Thursday, 5 days ago, left sided jaw pain and swelling started. No fever, chills. Pain 9/10.   .. Active Ambulatory Problems    Diagnosis Date Noted   Elevated blood pressure 09/03/2015   Tobacco dependence 09/03/2015   Lumbar post-laminectomy syndrome 09/03/2015   Insomnia 09/03/2015   Restless leg syndrome, uncontrolled 09/03/2015   Essential hypertension, benign 09/17/2015   Depression, major, single episode, moderate (HCC) 02/14/2017   Dyslipidemia 03/04/2017   Bursitis of intermetatarsal bursa of left foot 02/28/2018   Chronic pain syndrome 03/02/2018   Metatarsalgia of left foot 11/02/2018   Scrotal pain 01/10/2019   Primary osteoarthritis of both knees 03/28/2019   Benzodiazepine misuse 07/10/2019   Sebaceous cyst of right thumb 05/10/2020   Current severe episode of major depressive disorder without psychotic features without prior episode (HCC) 05/13/2020   Anxiety 05/13/2020   At high risk for falls 05/13/2020   Resolved Ambulatory Problems    Diagnosis Date Noted   Radiculopathy of lumbar region 08/19/2010   Spinal stenosis at L4-L5 level 05/24/2014   Degenerative disc disease, lumbar 01/10/2015   Cramp of both lower extremities 09/03/2015   Past Medical History:  Diagnosis Date   Headache    Hyperlipemia    Hypertension       Review of Systems See HPI.     Objective:   Physical Exam  Left sided firm and very painful anterior cervical adenopathy.  Generalized swelling of left jaw and into left anterior neck.  Poor dentition Pus in last molar socket left lower jaw Not able to open jaw      Assessment & Plan:   .Marland KitchenKai was seen today for acute visit.  Diagnoses and all orders for this visit:  Dental infection -     CT Soft Tissue Neck W Contrast; Future -     azithromycin (ZITHROMAX) 250 MG tablet; Take 2 tablets on day 1, then 1 tablet daily on days 2 through 5 -     ibuprofen (ADVIL) 800 MG tablet; Take 1 tablet (800 mg total) by mouth every 8 (eight) hours as needed for moderate pain. -     HYDROcodone-acetaminophen (NORCO/VICODIN) 5-325 MG tablet; Take 1 tablet by mouth every 6 (six) hours as needed for up to 5 days for moderate pain. -     cefTRIAXone (ROCEPHIN) injection 1 g  Essential hypertension, benign -     lisinopril (ZESTRIL) 5 MG tablet; Take 1 tablet (5 mg total) by mouth daily.  Jaw pain, non-TMJ -     CT Soft Tissue Neck W Contrast; Future -     azithromycin (ZITHROMAX) 250 MG tablet; Take 2 tablets on day 1, then 1 tablet daily on days 2 through 5 -     ibuprofen (ADVIL) 800 MG tablet; Take 1 tablet (800 mg total) by mouth every 8 (eight) hours as needed for moderate pain. -     HYDROcodone-acetaminophen (NORCO/VICODIN) 5-325 MG tablet; Take 1 tablet by mouth every 6 (six) hours as needed for up to 5 days for moderate pain. -     cefTRIAXone (ROCEPHIN) injection  1 g  Cervical adenopathy -     CT Soft Tissue Neck W Contrast; Future -     azithromycin (ZITHROMAX) 250 MG tablet; Take 2 tablets on day 1, then 1 tablet daily on days 2 through 5 -     ibuprofen (ADVIL) 800 MG tablet; Take 1 tablet (800 mg total) by mouth every 8 (eight) hours as needed for moderate pain. -     HYDROcodone-acetaminophen (NORCO/VICODIN) 5-325 MG tablet; Take 1 tablet by mouth every 6 (six) hours as needed for up to 5 days for moderate pain. -     cefTRIAXone (ROCEPHIN) injection 1 g  Other orders -     Discontinue: HYDROcodone-acetaminophen (NORCO/VICODIN) 5-325 MG tablet; Take 1 tablet by mouth every 6 (six) hours as needed for up to 5 days for moderate pain.  Appears to certainly have dental  infection.  1 gram of rocephin and start zpak tomorrow.  Need to rule out abscess. CT of neck ordered, stat.  Norco for break through pain for 5 days.  Marland KitchenMarland KitchenPDMP reviewed during this encounter. Discussed to use sparingly.  Ibuprofen 800mg  given to start.  Ice jaw.  If no abscess follow up in next week if abscess will need to be seen ASAP for drainage.

## 2020-12-31 ENCOUNTER — Encounter: Payer: Self-pay | Admitting: Physician Assistant

## 2021-01-01 NOTE — Progress Notes (Unsigned)
Patient is scheduled on 01/01/21 at 1:00 and is aware of the appointment - CF

## 2021-02-06 ENCOUNTER — Telehealth: Payer: Self-pay | Admitting: Physician Assistant

## 2021-02-06 ENCOUNTER — Ambulatory Visit (INDEPENDENT_AMBULATORY_CARE_PROVIDER_SITE_OTHER): Payer: Medicare Other

## 2021-02-06 ENCOUNTER — Other Ambulatory Visit: Payer: Self-pay

## 2021-02-06 ENCOUNTER — Ambulatory Visit (INDEPENDENT_AMBULATORY_CARE_PROVIDER_SITE_OTHER): Payer: Medicare Other | Admitting: Sports Medicine

## 2021-02-06 DIAGNOSIS — M170 Bilateral primary osteoarthritis of knee: Secondary | ICD-10-CM

## 2021-02-06 DIAGNOSIS — M961 Postlaminectomy syndrome, not elsewhere classified: Secondary | ICD-10-CM

## 2021-02-06 NOTE — Assessment & Plan Note (Signed)
Andrew Bean returns, he is a pleasant 46 year old male with bilateral knee osteoarthritis, chronic process with exacerbation, pharmacologic intervention today. Last injected in the summertime/fall of last year, repeat bilateral knee injections today, return to see me as needed.

## 2021-02-06 NOTE — Telephone Encounter (Signed)
Patient came in to office to drop off Disability paperwork. Patient was informed of a possible $29 fee and possible 3-5 day turn around. Paperwork placed in providers box. - lmr

## 2021-02-06 NOTE — Telephone Encounter (Signed)
Previous forms were completed by Dr. [name] due to back issues. He has seen him for this and treated him for years. Forms given to Dr. Karie Schwalbe.

## 2021-02-06 NOTE — Progress Notes (Signed)
° ° °  Procedures performed today:    Procedure: Real-time Ultrasound Guided injection of the left knee Device: Samsung HS60  Verbal informed consent obtained.  Time-out conducted.  Noted no overlying erythema, induration, or other signs of local infection.  Skin prepped in a sterile fashion.  Local anesthesia: Topical Ethyl chloride.  With sterile technique and under real time ultrasound guidance: Noted trace effusion, 1 cc Kenalog 40, 2 cc lidocaine, 2 cc bupivacaine injected easily Completed without difficulty  Advised to call if fevers/chills, erythema, induration, drainage, or persistent bleeding.  Images permanently stored and available for review in PACS.  Impression: Technically successful ultrasound guided injection.  Procedure: Real-time Ultrasound Guided injection of the right knee Device: Samsung HS60  Verbal informed consent obtained.  Time-out conducted.  Noted no overlying erythema, induration, or other signs of local infection.  Skin prepped in a sterile fashion.  Local anesthesia: Topical Ethyl chloride.  With sterile technique and under real time ultrasound guidance: Noted trace effusion, 1 cc Kenalog 40, 2 cc lidocaine, 2 cc bupivacaine injected easily Completed without difficulty  Advised to call if fevers/chills, erythema, induration, drainage, or persistent bleeding.  Images permanently stored and available for review in PACS.  Impression: Technically successful ultrasound guided injection.  Independent interpretation of notes and tests performed by another provider:   None.  Brief History, Exam, Impression, and Recommendations:    Primary osteoarthritis of both knees Andrew Bean returns, he is a pleasant 46 year old male with bilateral knee osteoarthritis, chronic process with exacerbation, pharmacologic intervention today. Last injected in the summertime/fall of last year, repeat bilateral knee injections today, return to see me as needed.  Lumbar  post-laminectomy syndrome Andrew Bean does have a very complex low back history, postlaminectomy syndrome. He is post L3-L5 fusion, historically the L3-L5 fusion has looked uncomplicated, there was a new disc retrusion at L5-S1 level on an MRI from early in 2021. There is also multilevel facet arthropathy, he did well after multilevel facet radiofrequency ablation. We have had Dr. Laurian Brim doing facet radiofrequency ablations, the last was in May 2022, it is now time to do another ablation, referral placed for bilateral L5-S1 facet RFA.     ___________________________________________ Andrew Bean. Andrew Bean, M.D., ABFM., CAQSM. Primary Care and Sports Medicine [facility]  Adjunct Instructor of Family Medicine  [employer]

## 2021-02-06 NOTE — Assessment & Plan Note (Signed)
Andrew Bean does have a very complex low back history, postlaminectomy syndrome. He is post L3-L5 fusion, historically the L3-L5 fusion has looked uncomplicated, there was a new disc retrusion at L5-S1 level on an MRI from early in 2021. There is also multilevel facet arthropathy, he did well after multilevel facet radiofrequency ablation. We have had Dr. Laurian Brim doing facet radiofrequency ablations, the last was in May 2022, it is now time to do another ablation, referral placed for bilateral L5-S1 facet RFA.

## 2021-02-14 ENCOUNTER — Encounter: Payer: Self-pay | Admitting: Sports Medicine

## 2021-02-14 NOTE — Telephone Encounter (Signed)
FInished today

## 2021-02-14 NOTE — Telephone Encounter (Signed)
Left msg that forms and records were completed and at the front desk for pick up.

## 2021-02-18 DIAGNOSIS — M5459 Other low back pain: Secondary | ICD-10-CM | POA: Diagnosis not present

## 2021-02-18 DIAGNOSIS — M47816 Spondylosis without myelopathy or radiculopathy, lumbar region: Secondary | ICD-10-CM | POA: Diagnosis not present

## 2021-04-01 DIAGNOSIS — M961 Postlaminectomy syndrome, not elsewhere classified: Secondary | ICD-10-CM | POA: Diagnosis not present

## 2021-04-01 DIAGNOSIS — M533 Sacrococcygeal disorders, not elsewhere classified: Secondary | ICD-10-CM | POA: Diagnosis not present

## 2021-04-01 DIAGNOSIS — M4726 Other spondylosis with radiculopathy, lumbar region: Secondary | ICD-10-CM | POA: Diagnosis not present

## 2021-04-01 DIAGNOSIS — M5136 Other intervertebral disc degeneration, lumbar region: Secondary | ICD-10-CM | POA: Diagnosis not present

## 2021-04-01 DIAGNOSIS — M48061 Spinal stenosis, lumbar region without neurogenic claudication: Secondary | ICD-10-CM | POA: Diagnosis not present

## 2021-04-01 DIAGNOSIS — G8929 Other chronic pain: Secondary | ICD-10-CM | POA: Diagnosis not present

## 2021-04-01 DIAGNOSIS — G894 Chronic pain syndrome: Secondary | ICD-10-CM | POA: Diagnosis not present

## 2021-04-01 DIAGNOSIS — M5459 Other low back pain: Secondary | ICD-10-CM | POA: Diagnosis not present

## 2021-04-01 DIAGNOSIS — M7918 Myalgia, other site: Secondary | ICD-10-CM | POA: Diagnosis not present

## 2021-04-25 ENCOUNTER — Encounter: Payer: Self-pay | Admitting: Neurology

## 2021-04-25 NOTE — Telephone Encounter (Signed)
Mychart message sent to patient.

## 2021-05-02 DIAGNOSIS — M4726 Other spondylosis with radiculopathy, lumbar region: Secondary | ICD-10-CM | POA: Diagnosis not present

## 2021-05-02 DIAGNOSIS — M5136 Other intervertebral disc degeneration, lumbar region: Secondary | ICD-10-CM | POA: Diagnosis not present

## 2021-06-13 DIAGNOSIS — R1031 Right lower quadrant pain: Secondary | ICD-10-CM | POA: Diagnosis not present

## 2021-06-13 DIAGNOSIS — G894 Chronic pain syndrome: Secondary | ICD-10-CM | POA: Diagnosis not present

## 2021-06-13 DIAGNOSIS — M5459 Other low back pain: Secondary | ICD-10-CM | POA: Diagnosis not present

## 2021-06-13 DIAGNOSIS — M533 Sacrococcygeal disorders, not elsewhere classified: Secondary | ICD-10-CM | POA: Diagnosis not present

## 2021-06-13 DIAGNOSIS — R1032 Left lower quadrant pain: Secondary | ICD-10-CM | POA: Diagnosis not present

## 2021-06-13 DIAGNOSIS — M5136 Other intervertebral disc degeneration, lumbar region: Secondary | ICD-10-CM | POA: Diagnosis not present

## 2021-06-13 DIAGNOSIS — M4726 Other spondylosis with radiculopathy, lumbar region: Secondary | ICD-10-CM | POA: Diagnosis not present

## 2021-06-13 DIAGNOSIS — M961 Postlaminectomy syndrome, not elsewhere classified: Secondary | ICD-10-CM | POA: Diagnosis not present

## 2021-06-13 DIAGNOSIS — M48061 Spinal stenosis, lumbar region without neurogenic claudication: Secondary | ICD-10-CM | POA: Diagnosis not present

## 2021-08-02 DIAGNOSIS — E860 Dehydration: Secondary | ICD-10-CM | POA: Diagnosis not present

## 2021-08-02 DIAGNOSIS — Z91030 Bee allergy status: Secondary | ICD-10-CM | POA: Diagnosis not present

## 2021-08-02 DIAGNOSIS — Z888 Allergy status to other drugs, medicaments and biological substances status: Secondary | ICD-10-CM | POA: Diagnosis not present

## 2021-08-02 DIAGNOSIS — R000 Tachycardia, unspecified: Secondary | ICD-10-CM | POA: Diagnosis not present

## 2021-08-02 DIAGNOSIS — R112 Nausea with vomiting, unspecified: Secondary | ICD-10-CM | POA: Diagnosis not present

## 2021-08-02 DIAGNOSIS — R1084 Generalized abdominal pain: Secondary | ICD-10-CM | POA: Diagnosis not present

## 2021-08-02 DIAGNOSIS — K529 Noninfective gastroenteritis and colitis, unspecified: Secondary | ICD-10-CM | POA: Diagnosis not present

## 2021-08-02 DIAGNOSIS — F17210 Nicotine dependence, cigarettes, uncomplicated: Secondary | ICD-10-CM | POA: Diagnosis not present

## 2021-08-04 ENCOUNTER — Encounter: Payer: Self-pay | Admitting: Physician Assistant

## 2021-08-04 MED ORDER — ONDANSETRON HCL 8 MG PO TABS: 8.0000 mg | ORAL_TABLET | Freq: Three times a day (TID) | ORAL | 0 refills | Status: DC | PRN

## 2021-08-04 NOTE — Telephone Encounter (Signed)
ER note from two days ago in Epic.   He was prescribed #12 of phenergan and zofran. Please advise on refills.

## 2021-08-14 DIAGNOSIS — M5459 Other low back pain: Secondary | ICD-10-CM | POA: Diagnosis not present

## 2021-08-14 DIAGNOSIS — M47816 Spondylosis without myelopathy or radiculopathy, lumbar region: Secondary | ICD-10-CM | POA: Diagnosis not present

## 2021-08-21 DIAGNOSIS — M961 Postlaminectomy syndrome, not elsewhere classified: Secondary | ICD-10-CM | POA: Diagnosis not present

## 2021-08-21 DIAGNOSIS — M533 Sacrococcygeal disorders, not elsewhere classified: Secondary | ICD-10-CM | POA: Diagnosis not present

## 2021-08-21 DIAGNOSIS — M48061 Spinal stenosis, lumbar region without neurogenic claudication: Secondary | ICD-10-CM | POA: Diagnosis not present

## 2021-08-21 DIAGNOSIS — M4726 Other spondylosis with radiculopathy, lumbar region: Secondary | ICD-10-CM | POA: Diagnosis not present

## 2021-08-21 DIAGNOSIS — M5136 Other intervertebral disc degeneration, lumbar region: Secondary | ICD-10-CM | POA: Diagnosis not present

## 2021-08-21 DIAGNOSIS — G894 Chronic pain syndrome: Secondary | ICD-10-CM | POA: Diagnosis not present

## 2021-08-21 DIAGNOSIS — M5459 Other low back pain: Secondary | ICD-10-CM | POA: Diagnosis not present

## 2021-08-25 DIAGNOSIS — M5136 Other intervertebral disc degeneration, lumbar region: Secondary | ICD-10-CM | POA: Diagnosis not present

## 2021-08-25 DIAGNOSIS — M4726 Other spondylosis with radiculopathy, lumbar region: Secondary | ICD-10-CM | POA: Diagnosis not present

## 2021-08-25 DIAGNOSIS — M48061 Spinal stenosis, lumbar region without neurogenic claudication: Secondary | ICD-10-CM | POA: Diagnosis not present

## 2021-09-08 ENCOUNTER — Ambulatory Visit (INDEPENDENT_AMBULATORY_CARE_PROVIDER_SITE_OTHER): Payer: Medicare Other | Admitting: Physician Assistant

## 2021-09-08 ENCOUNTER — Encounter: Payer: Self-pay | Admitting: Physician Assistant

## 2021-09-08 ENCOUNTER — Ambulatory Visit (INDEPENDENT_AMBULATORY_CARE_PROVIDER_SITE_OTHER): Payer: Medicare Other

## 2021-09-08 VITALS — BP 147/90 | HR 88 | Ht 73.0 in | Wt 200.0 lb

## 2021-09-08 DIAGNOSIS — S299XXA Unspecified injury of thorax, initial encounter: Secondary | ICD-10-CM

## 2021-09-08 DIAGNOSIS — R0781 Pleurodynia: Secondary | ICD-10-CM

## 2021-09-08 DIAGNOSIS — Z981 Arthrodesis status: Secondary | ICD-10-CM | POA: Diagnosis not present

## 2021-09-08 DIAGNOSIS — W19XXXA Unspecified fall, initial encounter: Secondary | ICD-10-CM

## 2021-09-08 DIAGNOSIS — S20212A Contusion of left front wall of thorax, initial encounter: Secondary | ICD-10-CM

## 2021-09-08 MED ORDER — LIDOCAINE 5 % EX PTCH: 1.0000 | MEDICATED_PATCH | CUTANEOUS | 0 refills | Status: DC

## 2021-09-08 MED ORDER — HYDROCODONE-ACETAMINOPHEN 5-325 MG PO TABS: 1.0000 | ORAL_TABLET | Freq: Three times a day (TID) | ORAL | 0 refills | Status: AC | PRN

## 2021-09-08 MED ORDER — KETOROLAC TROMETHAMINE 60 MG/2ML IM SOLN
60.0000 mg | Freq: Once | INTRAMUSCULAR | Status: AC
  Administered 2021-09-08: 60 mg via INTRAMUSCULAR

## 2021-09-08 NOTE — Addendum Note (Signed)
Addended bySilvio Pate on: 09/08/2021 02:44 PM   Modules accepted: Orders

## 2021-09-08 NOTE — Patient Instructions (Signed)
Rib Contusion A rib contusion is a deep bruise on the rib area. Contusions are the result of a blunt trauma that causes bleeding and injury to the tissues under the skin. A rib contusion may involve bruising of the ribs and of the skin and muscles in the area. The skin over the contusion may turn blue, purple, or yellow. Minor injuries result in a painless contusion. More severe contusions may be painful and swollen for a few weeks. What are the causes? This condition is usually caused by a hard, direct hit to an area of the body. This often occurs while playing contact sports. What are the signs or symptoms? Symptoms of this condition include: Swelling and redness of the injured area. Discoloration of the injured area. Tenderness and soreness of the injured area. Pain with or without movement. Pain when breathing in. How is this diagnosed? This condition may be diagnosed based on: Your symptoms and medical history. A physical exam. Imaging tests--such as an X-ray, CT scan, or MRI--to determine if there were internal injuries or broken bones (fractures). How is this treated? This condition may be treated with: Rest. This is often the best treatment for a rib contusion. Ice packs. This reduces swelling and inflammation. Deep-breathing exercises. These may be recommended to reduce the risk for lung collapse and pneumonia. Medicines. Over-the-counter or prescription medicines may be given to control pain. Injection of a numbing medicine around the nerve near your injury (nerve block). Follow these instructions at home: Medicines Take over-the-counter and prescription medicines only as told by your health care provider. Ask your health care provider if the medicine prescribed to you: Requires you to avoid driving or using machinery. Can cause constipation. You may need to take these actions to prevent or treat constipation: Drink enough fluid to keep your urine pale yellow. Take  over-the-counter or prescription medicines. Eat foods that are high in fiber, such as beans, whole grains, and fresh fruits and vegetables. Limit foods that are high in fat and processed sugars, such as fried or sweet foods. Managing pain, stiffness, and swelling If directed, put ice on the injured area. To do this: Put ice in a plastic bag. Place a towel between your skin and the bag. Leave the ice on for 20 minutes, 2-3 times a day. Remove the ice if your skin turns bright red. This is very important. If you cannot feel pain, heat, or cold, you have a greater risk of damage to the area.  Activity Rest the injured area. Avoid strenuous activity and any activities or movements that cause pain. Be careful during activities, and avoid bumping the injured area. Do not lift anything that is heavier than 5 lb (2.3 kg), or the limit that you are told, until your health care provider says that it is safe. General instructions  Do not use any products that contain nicotine or tobacco, such as cigarettes, e-cigarettes, and chewing tobacco. These can delay healing. If you need help quitting, ask your health care provider. Do deep-breathing exercises as told by your health care provider. If you were given an incentive spirometer, use it every 1-2 hours while you are awake, or as recommended by your health care provider. This device measures how well you are filling your lungs with each breath. Keep all follow-up visits. This is important. Contact a health care provider if you have: Increased bruising or swelling. Pain that is not controlled with treatment. A fever. Get help right away if you: Have difficulty breathing or   shortness of breath. Develop a continual cough, or you cough up thick or bloody mucus from your lungs (sputum). Feel nauseous or you vomit. Have pain in your abdomen. These symptoms may represent a serious problem that is an emergency. Do not wait to see if the symptoms will go  away. Get medical help right away. Call your local emergency services (911 in the [place] Do not drive yourself to the hospital. Summary A rib contusion is a deep bruise on your rib area. Contusions are the result of a blunt trauma that causes bleeding and injury to the tissues under the skin. The skin over the contusion may turn blue, purple, or yellow. Minor injuries may cause a painless contusion. More severe contusions may be painful and swollen for a few weeks. Rest the injured area. Avoid strenuous activity and any activities or movements that cause pain. This information is not intended to replace advice given to you by your health care provider. Make sure you discuss any questions you have with your health care provider. Document Revised: 04/26/2019 Document Reviewed: 04/26/2019 Elsevier Patient Education  2023 Elsevier Inc.  

## 2021-09-08 NOTE — Progress Notes (Signed)
Acute Office Visit  Subjective:     Patient ID: Andrew Bean, male    DOB: 1975-03-26, 46 y.o.   MRN: [id number]  Chief Complaint  Patient presents with   Chest Pain    Rib pain s/p fall    HPI Patient is in today for left rib pain after falling out of the shower after left leg "gave out" and landing on his left ribs into a wooden shelf on Saturday, 3 days ago. Pain is 9/10 with breathing, coughing, laughing, moving. Ibuprofen has only helped very little. He finds himself holding his breath due to pain.   .. Active Ambulatory Problems    Diagnosis Date Noted   Elevated blood pressure 09/03/2015   Tobacco dependence 09/03/2015   Lumbar post-laminectomy syndrome 09/03/2015   Insomnia 09/03/2015   Restless leg syndrome, uncontrolled 09/03/2015   Essential hypertension, benign 09/17/2015   Depression, major, single episode, moderate (HCC) 02/14/2017   Dyslipidemia 03/04/2017   Bursitis of intermetatarsal bursa of left foot 02/28/2018   Chronic pain syndrome 03/02/2018   Metatarsalgia of left foot 11/02/2018   Scrotal pain 01/10/2019   Primary osteoarthritis of both knees 03/28/2019   Benzodiazepine misuse 07/10/2019   Sebaceous cyst of right thumb 05/10/2020   Current severe episode of major depressive disorder without psychotic features without prior episode (HCC) 05/13/2020   Anxiety 05/13/2020   At high risk for falls 05/13/2020   Resolved Ambulatory Problems    Diagnosis Date Noted   Radiculopathy of lumbar region 08/19/2010   Spinal stenosis at L4-L5 level 05/24/2014   Degenerative disc disease, lumbar 01/10/2015   Cramp of both lower extremities 09/03/2015   Past Medical History:  Diagnosis Date   Headache    Hyperlipemia    Hypertension      ROS See HPI.      Objective:    BP (!) 147/90   Pulse 88   Ht 6\' 1"  (1.854 m)   Wt 200 lb (90.7 kg)   SpO2 100%   BMI 26.39 kg/m  BP Readings from Last 3 Encounters:  09/08/21 (!) 147/90  12/30/20 (!)  143/86  10/14/20 131/85      Physical Exam Vitals reviewed.  Constitutional:      Appearance: Normal appearance.  Cardiovascular:     Rate and Rhythm: Normal rate.  Pulmonary:     Effort: Pulmonary effort is normal.     Breath sounds: Normal breath sounds.     Comments: Tenderness to palpation over lower 3 ribs anterior and lateral, no posterior pain.  No bruising or swelling over left ribs.  Abdominal:     General: There is no distension.     Palpations: Abdomen is soft. There is no mass.     Tenderness: There is no abdominal tenderness. There is no right CVA tenderness, left CVA tenderness, guarding or rebound.     Hernia: No hernia is present.  Neurological:     Mental Status: He is alert.          Assessment & Plan:  .11/12/22[name] was seen today for chest pain.  Diagnoses and all orders for this visit:  Contusion of rib on left side, initial encounter -     DG Ribs Unilateral W/Chest Left; Future -     lidocaine (LIDODERM) 5 %; Place 1 patch onto the skin daily. Remove & Discard patch within 12 hours or as directed by MD -     HYDROcodone-acetaminophen (NORCO/VICODIN) 5-325 MG tablet; Take 1 tablet by mouth  every 8 (eight) hours as needed for up to 5 days for moderate pain.  Rib pain on left side -     DG Ribs Unilateral W/Chest Left; Future -     lidocaine (LIDODERM) 5 %; Place 1 patch onto the skin daily. Remove & Discard patch within 12 hours or as directed by MD -     HYDROcodone-acetaminophen (NORCO/VICODIN) 5-325 MG tablet; Take 1 tablet by mouth every 8 (eight) hours as needed for up to 5 days for moderate pain.  Fall, initial encounter -     DG Ribs Unilateral W/Chest Left; Future -     lidocaine (LIDODERM) 5 %; Place 1 patch onto the skin daily. Remove & Discard patch within 12 hours or as directed by MD -     HYDROcodone-acetaminophen (NORCO/VICODIN) 5-325 MG tablet; Take 1 tablet by mouth every 8 (eight) hours as needed for up to 5 days for moderate  pain.   Xray did not show any fractures Discussed rib contusion Toradol 60mg  IM given in office today HO given Continue ibuprofen Norco for next 2-3 days then start tylenol for pain . PDMP reviewed during this encounter. No controlled substances since 01/2021 Lidocaine patches every 12 hours Lots of ice Good deep breathing to avoid sequela infection Discussed pain likely for next 2-3 weeks Consider some ace bandage wrap if helps with pain for a few hours a day Follow up as needed or with any worsening symptoms  02/2021, PA-C

## 2021-09-19 ENCOUNTER — Telehealth: Payer: Self-pay

## 2021-09-19 NOTE — Telephone Encounter (Addendum)
Initiated Prior authorization [id number] 5% patches Via: Covermymeds Case/Key:  [id number] Status: approved  as of 09/19/21 Reason:approved through 02/01/2022 Notified Pt via: Mychart

## 2021-09-22 DIAGNOSIS — G8929 Other chronic pain: Secondary | ICD-10-CM | POA: Diagnosis not present

## 2021-09-22 DIAGNOSIS — M533 Sacrococcygeal disorders, not elsewhere classified: Secondary | ICD-10-CM | POA: Diagnosis not present

## 2021-09-23 DIAGNOSIS — E785 Hyperlipidemia, unspecified: Secondary | ICD-10-CM | POA: Diagnosis not present

## 2021-09-23 DIAGNOSIS — Z888 Allergy status to other drugs, medicaments and biological substances status: Secondary | ICD-10-CM | POA: Diagnosis not present

## 2021-09-23 DIAGNOSIS — I10 Essential (primary) hypertension: Secondary | ICD-10-CM | POA: Diagnosis not present

## 2021-09-23 DIAGNOSIS — G8929 Other chronic pain: Secondary | ICD-10-CM | POA: Diagnosis not present

## 2021-09-23 DIAGNOSIS — Z91030 Bee allergy status: Secondary | ICD-10-CM | POA: Diagnosis not present

## 2021-09-23 DIAGNOSIS — F17210 Nicotine dependence, cigarettes, uncomplicated: Secondary | ICD-10-CM | POA: Diagnosis not present

## 2021-09-23 DIAGNOSIS — M961 Postlaminectomy syndrome, not elsewhere classified: Secondary | ICD-10-CM | POA: Diagnosis not present

## 2021-09-23 DIAGNOSIS — M47816 Spondylosis without myelopathy or radiculopathy, lumbar region: Secondary | ICD-10-CM | POA: Diagnosis not present

## 2021-09-23 DIAGNOSIS — Z79899 Other long term (current) drug therapy: Secondary | ICD-10-CM | POA: Diagnosis not present

## 2021-09-23 DIAGNOSIS — F17290 Nicotine dependence, other tobacco product, uncomplicated: Secondary | ICD-10-CM | POA: Diagnosis not present

## 2021-09-23 DIAGNOSIS — M5136 Other intervertebral disc degeneration, lumbar region: Secondary | ICD-10-CM | POA: Diagnosis not present

## 2021-09-23 DIAGNOSIS — M48061 Spinal stenosis, lumbar region without neurogenic claudication: Secondary | ICD-10-CM | POA: Diagnosis not present

## 2021-09-24 ENCOUNTER — Encounter: Payer: Self-pay | Admitting: General Practice

## 2021-10-07 DIAGNOSIS — M533 Sacrococcygeal disorders, not elsewhere classified: Secondary | ICD-10-CM | POA: Diagnosis not present

## 2021-10-07 DIAGNOSIS — M5459 Other low back pain: Secondary | ICD-10-CM | POA: Diagnosis not present

## 2021-10-07 DIAGNOSIS — M5136 Other intervertebral disc degeneration, lumbar region: Secondary | ICD-10-CM | POA: Diagnosis not present

## 2021-10-07 DIAGNOSIS — M4726 Other spondylosis with radiculopathy, lumbar region: Secondary | ICD-10-CM | POA: Diagnosis not present

## 2021-10-07 DIAGNOSIS — M48061 Spinal stenosis, lumbar region without neurogenic claudication: Secondary | ICD-10-CM | POA: Diagnosis not present

## 2021-10-07 DIAGNOSIS — M961 Postlaminectomy syndrome, not elsewhere classified: Secondary | ICD-10-CM | POA: Diagnosis not present

## 2021-10-07 DIAGNOSIS — G894 Chronic pain syndrome: Secondary | ICD-10-CM | POA: Diagnosis not present

## 2021-12-08 DIAGNOSIS — Z981 Arthrodesis status: Secondary | ICD-10-CM | POA: Diagnosis not present

## 2021-12-08 DIAGNOSIS — M533 Sacrococcygeal disorders, not elsewhere classified: Secondary | ICD-10-CM | POA: Diagnosis not present

## 2021-12-08 DIAGNOSIS — G588 Other specified mononeuropathies: Secondary | ICD-10-CM | POA: Diagnosis not present

## 2022-02-05 DIAGNOSIS — M5136 Other intervertebral disc degeneration, lumbar region: Secondary | ICD-10-CM | POA: Diagnosis not present

## 2022-02-05 DIAGNOSIS — Z981 Arthrodesis status: Secondary | ICD-10-CM | POA: Diagnosis not present

## 2022-02-05 DIAGNOSIS — M5459 Other low back pain: Secondary | ICD-10-CM | POA: Diagnosis not present

## 2022-02-05 DIAGNOSIS — M48061 Spinal stenosis, lumbar region without neurogenic claudication: Secondary | ICD-10-CM | POA: Diagnosis not present

## 2022-02-05 DIAGNOSIS — G894 Chronic pain syndrome: Secondary | ICD-10-CM | POA: Diagnosis not present

## 2022-02-05 DIAGNOSIS — M961 Postlaminectomy syndrome, not elsewhere classified: Secondary | ICD-10-CM | POA: Diagnosis not present

## 2022-02-05 DIAGNOSIS — M4316 Spondylolisthesis, lumbar region: Secondary | ICD-10-CM | POA: Diagnosis not present

## 2022-02-05 DIAGNOSIS — M4726 Other spondylosis with radiculopathy, lumbar region: Secondary | ICD-10-CM | POA: Diagnosis not present

## 2022-03-25 ENCOUNTER — Telehealth: Payer: Self-pay | Admitting: Physician Assistant

## 2022-03-25 NOTE — Telephone Encounter (Signed)
Called patient to schedule Medicare Annual Wellness Visit (AWV). Left message for patient to call back and schedule Medicare Annual Wellness Visit (AWV).  Last date of AWV: Never  Please schedule an appointment at any time with Nurse Health Advisor.  If any questions, please contact me at [phone].  Thank you ,  Lin Givens Patient Access Advocate II Direct Dial: [phone]

## 2022-04-21 ENCOUNTER — Ambulatory Visit (INDEPENDENT_AMBULATORY_CARE_PROVIDER_SITE_OTHER): Payer: Medicare Other | Admitting: Sports Medicine

## 2022-04-21 DIAGNOSIS — M170 Bilateral primary osteoarthritis of knee: Secondary | ICD-10-CM

## 2022-04-21 DIAGNOSIS — M961 Postlaminectomy syndrome, not elsewhere classified: Secondary | ICD-10-CM | POA: Diagnosis not present

## 2022-04-21 DIAGNOSIS — S298XXA Other specified injuries of thorax, initial encounter: Secondary | ICD-10-CM

## 2022-04-21 DIAGNOSIS — S20212A Contusion of left front wall of thorax, initial encounter: Secondary | ICD-10-CM | POA: Diagnosis not present

## 2022-04-21 DIAGNOSIS — W19XXXA Unspecified fall, initial encounter: Secondary | ICD-10-CM

## 2022-04-21 DIAGNOSIS — R0789 Other chest pain: Secondary | ICD-10-CM

## 2022-04-21 DIAGNOSIS — R0781 Pleurodynia: Secondary | ICD-10-CM | POA: Diagnosis not present

## 2022-04-21 MED ORDER — LIDOCAINE 5 % EX PTCH: 2.0000 | MEDICATED_PATCH | CUTANEOUS | 11 refills | Status: DC

## 2022-04-21 NOTE — Assessment & Plan Note (Signed)
Dear returns, is complex low back pain with postlaminectomy syndrome, he is post [id number] fusion, uncomplicated [id number] fusion for the new disc extrusion at the L5-S1 level on an MRI from 2021, he also has multilevel facet arthropathy and has done well after multilevel facet radiofrequency ablation with Dr. Francesco Runner. He does need a referral back to Dr. Francesco Runner as he is managing Oziel's back pain for now. Jazier does need additional lidocaine patches which I am happy to call in.

## 2022-04-21 NOTE — Assessment & Plan Note (Signed)
Bilateral knee osteoarthritis, needs new reaction knee braces with sleeves. Return as needed.

## 2022-04-21 NOTE — Progress Notes (Signed)
    Procedures performed today:    None.  Independent interpretation of notes and tests performed by another provider:   None.  Brief History, Exam, Impression, and Recommendations:    Lumbar post-laminectomy syndrome Dear returns, is complex low back pain with postlaminectomy syndrome, he is post [id number] fusion, uncomplicated [id number] fusion for the new disc extrusion at the L5-S1 level on an MRI from 2021, he also has multilevel facet arthropathy and has done well after multilevel facet radiofrequency ablation with Dr. Francesco Runner. He does need a referral back to Dr. Francesco Runner as he is managing Luisenrique's back pain for now. Wavie does need additional lidocaine patches which I am happy to call in.  Primary osteoarthritis of both knees Bilateral knee osteoarthritis, needs new reaction knee braces with sleeves. Return as needed.  Chronic process not at goal with pharmacologic intervention  ____________________________________________ Gwen Her. Dianah Field, M.D., ABFM., CAQSM., AME. Primary Care and Sports Medicine [facility]  Adjunct Professor of [place] [employer]  Risk manager

## 2022-04-22 ENCOUNTER — Ambulatory Visit (INDEPENDENT_AMBULATORY_CARE_PROVIDER_SITE_OTHER): Payer: Medicare Other | Admitting: Physician Assistant

## 2022-04-22 ENCOUNTER — Encounter: Payer: Self-pay | Admitting: Physician Assistant

## 2022-04-22 VITALS — BP 145/86 | HR 97 | Ht 73.0 in | Wt 189.0 lb

## 2022-04-22 DIAGNOSIS — M961 Postlaminectomy syndrome, not elsewhere classified: Secondary | ICD-10-CM

## 2022-04-22 DIAGNOSIS — Z1211 Encounter for screening for malignant neoplasm of colon: Secondary | ICD-10-CM

## 2022-04-22 DIAGNOSIS — Z0000 Encounter for general adult medical examination without abnormal findings: Secondary | ICD-10-CM | POA: Diagnosis not present

## 2022-04-22 DIAGNOSIS — F17200 Nicotine dependence, unspecified, uncomplicated: Secondary | ICD-10-CM

## 2022-04-22 DIAGNOSIS — Z1329 Encounter for screening for other suspected endocrine disorder: Secondary | ICD-10-CM

## 2022-04-22 DIAGNOSIS — E785 Hyperlipidemia, unspecified: Secondary | ICD-10-CM

## 2022-04-22 DIAGNOSIS — I10 Essential (primary) hypertension: Secondary | ICD-10-CM | POA: Diagnosis not present

## 2022-04-22 MED ORDER — LISINOPRIL 10 MG PO TABS: 10.0000 mg | ORAL_TABLET | Freq: Every day | ORAL | 1 refills | Status: DC

## 2022-04-22 MED ORDER — IBUPROFEN 800 MG PO TABS: 800.0000 mg | ORAL_TABLET | Freq: Three times a day (TID) | ORAL | 3 refills | Status: AC | PRN
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
## 2022-04-22 NOTE — Patient Instructions (Signed)
Health Maintenance, Male Adopting a healthy lifestyle and getting preventive care are important in promoting health and wellness. Ask your health care provider about: The right schedule for you to have regular tests and exams. Things you can do on your own to prevent diseases and keep yourself healthy. What should I know about diet, weight, and exercise? Eat a healthy diet  Eat a diet that includes plenty of vegetables, fruits, low-fat dairy products, and lean protein. Do not eat a lot of foods that are high in solid fats, added sugars, or sodium. Maintain a healthy weight Body mass index (BMI) is a measurement that can be used to identify possible weight problems. It estimates body fat based on height and weight. Your health care provider can help determine your BMI and help you achieve or maintain a healthy weight. Get regular exercise Get regular exercise. This is one of the most important things you can do for your health. Most adults should: Exercise for at least 150 minutes each week. The exercise should increase your heart rate and make you sweat (moderate-intensity exercise). Do strengthening exercises at least twice a week. This is in addition to the moderate-intensity exercise. Spend less time sitting. Even light physical activity can be beneficial. Watch cholesterol and blood lipids Have your blood tested for lipids and cholesterol at 47 years of age, then have this test every 5 years. You may need to have your cholesterol levels checked more often if: Your lipid or cholesterol levels are high. You are older than 47 years of age. You are at high risk for heart disease. What should I know about cancer screening? Many types of cancers can be detected early and may often be prevented. Depending on your health history and family history, you may need to have cancer screening at various ages. This may include screening for: Colorectal cancer. Prostate cancer. Skin cancer. Lung  cancer. What should I know about heart disease, diabetes, and high blood pressure? Blood pressure and heart disease High blood pressure causes heart disease and increases the risk of stroke. This is more likely to develop in people who have high blood pressure readings or are overweight. Talk with your health care provider about your target blood pressure readings. Have your blood pressure checked: Every 3-5 years if you are 18-39 years of age. Every year if you are 40 years old or older. If you are between the ages of 65 and 75 and are a current or former smoker, ask your health care provider if you should have a one-time screening for abdominal aortic aneurysm (AAA). Diabetes Have regular diabetes screenings. This checks your fasting blood sugar level. Have the screening done: Once every three years after age 45 if you are at a normal weight and have a low risk for diabetes. More often and at a younger age if you are overweight or have a high risk for diabetes. What should I know about preventing infection? Hepatitis B If you have a higher risk for hepatitis B, you should be screened for this virus. Talk with your health care provider to find out if you are at risk for hepatitis B infection. Hepatitis C Blood testing is recommended for: Everyone born from 1945 through 1965. Anyone with known risk factors for hepatitis C. Sexually transmitted infections (STIs) You should be screened each year for STIs, including gonorrhea and chlamydia, if: You are sexually active and are younger than 47 years of age. You are older than 47 years of age and your   health care provider tells you that you are at risk for this type of infection. Your sexual activity has changed since you were last screened, and you are at increased risk for chlamydia or gonorrhea. Ask your health care provider if you are at risk. Ask your health care provider about whether you are at high risk for HIV. Your health care provider  may recommend a prescription medicine to help prevent HIV infection. If you choose to take medicine to prevent HIV, you should first get tested for HIV. You should then be tested every 3 months for as long as you are taking the medicine. Follow these instructions at home: Alcohol use Do not drink alcohol if your health care provider tells you not to drink. If you drink alcohol: Limit how much you have to 0-2 drinks a day. Know how much alcohol is in your drink. In the [place] one drink equals one 12 oz bottle of beer (355 mL), one 5 oz glass of wine (148 mL), or one 1 oz glass of hard liquor (44 mL). Lifestyle Do not use any products that contain nicotine or tobacco. These products include cigarettes, chewing tobacco, and vaping devices, such as e-cigarettes. If you need help quitting, ask your health care provider. Do not use street drugs. Do not share needles. Ask your health care provider for help if you need support or information about quitting drugs. General instructions Schedule regular health, dental, and eye exams. Stay current with your vaccines. Tell your health care provider if: You often feel depressed. You have ever been abused or do not feel safe at home. Summary Adopting a healthy lifestyle and getting preventive care are important in promoting health and wellness. Follow your health care provider's instructions about healthy diet, exercising, and getting tested or screened for diseases. Follow your health care provider's instructions on monitoring your cholesterol and blood pressure. This information is not intended to replace advice given to you by your health care provider. Make sure you discuss any questions you have with your health care provider. Document Revised: 06/10/2020 Document Reviewed: 06/10/2020 Elsevier Patient Education  2023 Elsevier Inc.  

## 2022-04-23 LAB — LIPID PANEL W/REFLEX DIRECT LDL
Cholesterol: 310 mg/dL — ABNORMAL HIGH (ref <200)
HDL: 57 mg/dL (ref >=40)
LDL Cholesterol (Calc): 215 mg/dL (calc) — ABNORMAL HIGH
Non-HDL Cholesterol (Calc): 253 mg/dL (calc) — ABNORMAL HIGH (ref <130)
Total CHOL/HDL Ratio: 5.4 (calc) — ABNORMAL HIGH (ref <5.0)
Triglycerides: 200 mg/dL — ABNORMAL HIGH (ref <150)

## 2022-04-23 LAB — COMPLETE METABOLIC PANEL WITH GFR
AG Ratio: 2.1 (calc) (ref 1.0–2.5)
ALT: 23 U/L (ref 9–46)
AST: 20 U/L (ref 10–40)
Albumin: 4.9 g/dL (ref 3.6–5.1)
Alkaline phosphatase (APISO): 87 U/L (ref 36–130)
BUN: 11 mg/dL (ref 7–25)
CO2: 25 mmol/L (ref 20–32)
Calcium: 10.1 mg/dL (ref 8.6–10.3)
Chloride: 103 mmol/L (ref 98–110)
Creat: 0.88 mg/dL (ref 0.60–1.29)
Globulin: 2.3 g/dL (calc) (ref 1.9–3.7)
Glucose, Bld: 84 mg/dL (ref 65–99)
Potassium: 4.6 mmol/L (ref 3.5–5.3)
Sodium: 142 mmol/L (ref 135–146)
Total Bilirubin: 0.8 mg/dL (ref 0.2–1.2)
Total Protein: 7.2 g/dL (ref 6.1–8.1)
eGFR: 107 mL/min/{1.73_m2} (ref >=60)

## 2022-04-23 LAB — CBC WITH DIFFERENTIAL/PLATELET
Absolute Monocytes: 683 cells/uL (ref 200–950)
Basophils Absolute: 63 cells/uL (ref 0–200)
Basophils Relative: 0.6 %
Eosinophils Absolute: 273 cells/uL (ref 15–500)
Eosinophils Relative: 2.6 %
HCT: 53.9 % — ABNORMAL HIGH (ref 38.5–50.0)
Hemoglobin: 19.3 g/dL — ABNORMAL HIGH (ref 13.2–17.1)
Lymphs Abs: 3896 cells/uL (ref 850–3900)
MCH: 34.8 pg — ABNORMAL HIGH (ref 27.0–33.0)
MCHC: 35.8 g/dL (ref 32.0–36.0)
MCV: 97.1 fL (ref 80.0–100.0)
MPV: 9.9 fL (ref 7.5–12.5)
Monocytes Relative: 6.5 %
Neutro Abs: 5586 cells/uL (ref 1500–7800)
Neutrophils Relative %: 53.2 %
Platelets: 332 10*3/uL (ref 140–400)
RBC: 5.55 10*6/uL (ref 4.20–5.80)
RDW: 12.7 % (ref 11.0–15.0)
Total Lymphocyte: 37.1 %
WBC: 10.5 10*3/uL (ref 3.8–10.8)

## 2022-04-23 LAB — TSH: TSH: 1.34 mIU/L (ref 0.40–4.50)

## 2022-04-24 ENCOUNTER — Encounter: Payer: Self-pay | Admitting: Physician Assistant

## 2022-04-24 DIAGNOSIS — S20212A Contusion of left front wall of thorax, initial encounter: Secondary | ICD-10-CM

## 2022-04-24 DIAGNOSIS — W19XXXA Unspecified fall, initial encounter: Secondary | ICD-10-CM

## 2022-04-24 DIAGNOSIS — R0781 Pleurodynia: Secondary | ICD-10-CM

## 2022-04-24 DIAGNOSIS — I10 Essential (primary) hypertension: Secondary | ICD-10-CM

## 2022-04-24 NOTE — Progress Notes (Signed)
Andrew Bean,   Kidney, liver, glucose look good.   I know you have not responded to statins well but there are another class of medications called PsK-9, repatha which I think we should try. They are injectable medications every 2 weeks. Are you ok with trying repatha?   Your cholesterol is really high.  Marland Kitchen.The 10-year ASCVD risk score (Arnett DK, et al., 2019) is: 14.4%   Values used to calculate the score:     Age: 47 years     Sex: Male     Is Non-Hispanic African American: No     Diabetic: No     Tobacco smoker: Yes     Systolic Blood Pressure: [id number] mmHg     Is BP treated: Yes     HDL Cholesterol: 57 mg/dL     Total Cholesterol: 310 mg/dL  Your hemoglobin is really high. This is likely due to smoking. Do you think you could try cutting back at least by 1/4 or 1/2 and rechecking this in 2 months. It is making your blood thicker and increasing chances of stroke. I would also start a baby asa a day. Are you ok with this?

## 2022-04-28 MED ORDER — ROSUVASTATIN CALCIUM 10 MG PO TABS: 10.0000 mg | ORAL_TABLET | Freq: Every day | ORAL | 3 refills | Status: DC

## 2022-05-15 ENCOUNTER — Encounter: Payer: Self-pay | Admitting: Physician Assistant

## 2022-05-15 MED ORDER — EPINEPHRINE 0.3 MG/0.3ML IJ SOAJ: 0.3000 mg | INTRAMUSCULAR | 1 refills | Status: AC | PRN

## 2022-05-18 ENCOUNTER — Encounter: Payer: Self-pay | Admitting: *Deleted

## 2022-05-18 MED ORDER — LIDOCAINE 5 % EX PTCH: 2.0000 | MEDICATED_PATCH | CUTANEOUS | 11 refills | Status: AC

## 2022-05-18 MED ORDER — LISINOPRIL 10 MG PO TABS: 10.0000 mg | ORAL_TABLET | Freq: Every day | ORAL | 1 refills | Status: DC

## 2022-05-18 NOTE — Addendum Note (Signed)
Addended by: Chalmers Cater on: 05/18/2022 03:46 PM   Modules accepted: Orders

## 2022-06-17 DIAGNOSIS — M48061 Spinal stenosis, lumbar region without neurogenic claudication: Secondary | ICD-10-CM | POA: Diagnosis not present

## 2022-06-17 DIAGNOSIS — M5136 Other intervertebral disc degeneration, lumbar region: Secondary | ICD-10-CM | POA: Diagnosis not present

## 2022-06-17 DIAGNOSIS — M4726 Other spondylosis with radiculopathy, lumbar region: Secondary | ICD-10-CM | POA: Diagnosis not present

## 2022-06-17 DIAGNOSIS — M961 Postlaminectomy syndrome, not elsewhere classified: Secondary | ICD-10-CM | POA: Diagnosis not present

## 2022-06-17 DIAGNOSIS — G894 Chronic pain syndrome: Secondary | ICD-10-CM | POA: Diagnosis not present

## 2022-06-17 DIAGNOSIS — M5459 Other low back pain: Secondary | ICD-10-CM | POA: Diagnosis not present

## 2022-07-08 DIAGNOSIS — M5136 Other intervertebral disc degeneration, lumbar region: Secondary | ICD-10-CM | POA: Diagnosis not present

## 2022-07-08 DIAGNOSIS — M961 Postlaminectomy syndrome, not elsewhere classified: Secondary | ICD-10-CM | POA: Diagnosis not present

## 2022-07-08 DIAGNOSIS — M48061 Spinal stenosis, lumbar region without neurogenic claudication: Secondary | ICD-10-CM | POA: Diagnosis not present

## 2022-07-17 ENCOUNTER — Telehealth: Payer: Self-pay

## 2022-07-17 NOTE — Telephone Encounter (Signed)
Patient came into office to drop off Physician Disability forms, forms placed in Jade's box, thanks.

## 2022-07-23 DIAGNOSIS — M5459 Other low back pain: Secondary | ICD-10-CM | POA: Diagnosis not present

## 2022-07-23 DIAGNOSIS — M4726 Other spondylosis with radiculopathy, lumbar region: Secondary | ICD-10-CM | POA: Diagnosis not present

## 2022-07-23 DIAGNOSIS — M961 Postlaminectomy syndrome, not elsewhere classified: Secondary | ICD-10-CM | POA: Diagnosis not present

## 2022-07-23 DIAGNOSIS — G894 Chronic pain syndrome: Secondary | ICD-10-CM | POA: Diagnosis not present

## 2022-07-23 DIAGNOSIS — M48061 Spinal stenosis, lumbar region without neurogenic claudication: Secondary | ICD-10-CM | POA: Diagnosis not present

## 2022-07-23 DIAGNOSIS — M5136 Other intervertebral disc degeneration, lumbar region: Secondary | ICD-10-CM | POA: Diagnosis not present

## 2022-07-30 ENCOUNTER — Encounter: Payer: Self-pay | Admitting: Sports Medicine

## 2022-07-31 NOTE — Telephone Encounter (Signed)
Patient wife requested to check status of FMLA forms. Per Dr. [name], patient needs an appointment to complete disability forms. Ms. Days will check with patient an call back to schedule an appointment.

## 2022-08-04 ENCOUNTER — Encounter: Payer: Self-pay | Admitting: Sports Medicine

## 2022-08-04 ENCOUNTER — Ambulatory Visit (INDEPENDENT_AMBULATORY_CARE_PROVIDER_SITE_OTHER): Payer: Medicare Other | Admitting: Sports Medicine

## 2022-08-04 VITALS — BP 160/87 | HR 98 | Ht 73.0 in

## 2022-08-04 DIAGNOSIS — M961 Postlaminectomy syndrome, not elsewhere classified: Secondary | ICD-10-CM

## 2022-08-04 NOTE — Assessment & Plan Note (Addendum)
Andrew Bean returns, he has complex low back pain with postlaminectomy syndrome, he is post L3-L5 fusion, uncomplicated L3-L5 fusion for the new disc extrusion at the L5-S1 level on an MRI from 2021, he also has multilevel facet arthropathy and has done well after multilevel facet radiofrequency ablation with Dr. Laurian Brim. Continues with lidocaine patches and he will continue with Dr. Laurian Brim.  Update: We filled out his disability paperwork today.

## 2022-08-04 NOTE — Progress Notes (Signed)
    Procedures performed today:    None.  Independent interpretation of notes and tests performed by another provider:   None.  Brief History, Exam, Impression, and Recommendations:    Lumbar post-laminectomy syndrome Andrew Bean returns, he has complex low back pain with postlaminectomy syndrome, he is post L3-L5 fusion, uncomplicated L3-L5 fusion for the new disc extrusion at the L5-S1 level on an MRI from 2021, he also has multilevel facet arthropathy and has done well after multilevel facet radiofrequency ablation with Dr. Laurian Brim. Continues with lidocaine patches and he will continue with Dr. Laurian Brim.  Update: We filled out his disability paperwork today.  I spent 30 minutes of total time managing this patient today, this includes chart review, face to face, and non-face to face time.  ____________________________________________ [name]. [name], M.D., ABFM., CAQSM., AME. Primary Care and Sports Medicine [facility]  Adjunct Professor of Family Medicine  [employer]  Restaurant manager, fast food

## 2022-08-21 DIAGNOSIS — M47816 Spondylosis without myelopathy or radiculopathy, lumbar region: Secondary | ICD-10-CM | POA: Diagnosis not present

## 2022-08-21 DIAGNOSIS — G894 Chronic pain syndrome: Secondary | ICD-10-CM | POA: Diagnosis not present

## 2022-08-21 DIAGNOSIS — M48061 Spinal stenosis, lumbar region without neurogenic claudication: Secondary | ICD-10-CM | POA: Diagnosis not present

## 2022-09-28 DIAGNOSIS — M5459 Other low back pain: Secondary | ICD-10-CM | POA: Diagnosis not present

## 2022-09-28 DIAGNOSIS — M4726 Other spondylosis with radiculopathy, lumbar region: Secondary | ICD-10-CM | POA: Diagnosis not present

## 2022-09-28 DIAGNOSIS — M48061 Spinal stenosis, lumbar region without neurogenic claudication: Secondary | ICD-10-CM | POA: Diagnosis not present

## 2022-09-28 DIAGNOSIS — R29818 Other symptoms and signs involving the nervous system: Secondary | ICD-10-CM | POA: Diagnosis not present

## 2022-09-28 DIAGNOSIS — M5136 Other intervertebral disc degeneration, lumbar region: Secondary | ICD-10-CM | POA: Diagnosis not present

## 2022-09-28 DIAGNOSIS — G894 Chronic pain syndrome: Secondary | ICD-10-CM | POA: Diagnosis not present

## 2022-09-28 DIAGNOSIS — M961 Postlaminectomy syndrome, not elsewhere classified: Secondary | ICD-10-CM | POA: Diagnosis not present

## 2022-10-20 DIAGNOSIS — M5136 Other intervertebral disc degeneration, lumbar region: Secondary | ICD-10-CM | POA: Diagnosis not present

## 2022-10-20 DIAGNOSIS — M4726 Other spondylosis with radiculopathy, lumbar region: Secondary | ICD-10-CM | POA: Diagnosis not present

## 2022-10-23 ENCOUNTER — Encounter: Payer: Self-pay | Admitting: Physician Assistant

## 2022-10-23 ENCOUNTER — Ambulatory Visit (INDEPENDENT_AMBULATORY_CARE_PROVIDER_SITE_OTHER): Payer: Medicare Other | Admitting: Physician Assistant

## 2022-10-23 VITALS — BP 125/73 | HR 72 | Ht 73.0 in | Wt 184.8 lb

## 2022-10-23 DIAGNOSIS — F17200 Nicotine dependence, unspecified, uncomplicated: Secondary | ICD-10-CM | POA: Diagnosis not present

## 2022-10-23 DIAGNOSIS — G894 Chronic pain syndrome: Secondary | ICD-10-CM | POA: Diagnosis not present

## 2022-10-23 DIAGNOSIS — E785 Hyperlipidemia, unspecified: Secondary | ICD-10-CM

## 2022-10-23 DIAGNOSIS — I10 Essential (primary) hypertension: Secondary | ICD-10-CM

## 2022-10-23 DIAGNOSIS — Z23 Encounter for immunization: Secondary | ICD-10-CM

## 2022-10-23 DIAGNOSIS — F1091 Alcohol use, unspecified, in remission: Secondary | ICD-10-CM | POA: Insufficient documentation

## 2022-10-23 MED ORDER — LISINOPRIL 10 MG PO TABS: 10.0000 mg | ORAL_TABLET | Freq: Every day | ORAL | 1 refills | Status: DC

## 2022-10-24 LAB — CMP14+EGFR
ALT: 38 IU/L (ref 0–44)
AST: 25 IU/L (ref 0–40)
Albumin: 4.4 g/dL (ref 4.1–5.1)
Alkaline Phosphatase: 77 IU/L (ref 44–121)
BUN/Creatinine Ratio: 17 (ref 9–20)
BUN: 16 mg/dL (ref 6–24)
Bilirubin Total: 0.5 mg/dL (ref 0.0–1.2)
CO2: 24 mmol/L (ref 20–29)
Calcium: 9.4 mg/dL (ref 8.7–10.2)
Chloride: 102 mmol/L (ref 96–106)
Creatinine, Ser: 0.94 mg/dL (ref 0.76–1.27)
Globulin, Total: 1.7 g/dL (ref 1.5–4.5)
Glucose: 88 mg/dL (ref 70–99)
Potassium: 4.5 mmol/L (ref 3.5–5.2)
Sodium: 142 mmol/L (ref 134–144)
Total Protein: 6.1 g/dL (ref 6.0–8.5)
eGFR: 101 mL/min/{1.73_m2} (ref >59)

## 2022-10-24 LAB — LIPID PANEL
Chol/HDL Ratio: 3.1 ratio (ref 0.0–5.0)
Cholesterol, Total: 157 mg/dL (ref 100–199)
HDL: 51 mg/dL (ref >39)
LDL Chol Calc (NIH): 79 mg/dL (ref 0–99)
Triglycerides: 156 mg/dL — ABNORMAL HIGH (ref 0–149)
VLDL Cholesterol Cal: 27 mg/dL (ref 5–40)

## 2022-10-26 NOTE — Progress Notes (Signed)
Labs look good.  Kidney, liver, glucose, LDL, HDL look good.  Continue on crestor.  Your TG are just a little elevated.  Walking and decreasing fried/processed foods/fatty foods can help.

## 2022-10-26 NOTE — Progress Notes (Signed)
Established Patient Office Visit  Subjective   Patient ID: Andrew Bean, male    DOB: [birth year]  Age: 47 y.o. MRN: [id number]  Chief Complaint  Patient presents with   Medical Management of Chronic Issues    6 month f/u    HPI Pt is a 47 yo male with HTN, HLD, chronic low back pain on disability, current smoker who presents to the clinic for 6 month follow up.   Pt continues to be in daily pain. He is not on any pain medication. He does use lidocaine patches. He does get epidural injections from time to time.   He smokes daily and not planning to quit. He does not drink any alcohol anymore.   He takes his cholesterol and BP medication daily. Denies any CP, palpitations, headaches, vision changes.   .. Active Ambulatory Problems    Diagnosis Date Noted   Elevated blood pressure 09/03/2015   Tobacco dependence 09/03/2015   Lumbar post-laminectomy syndrome 09/03/2015   Insomnia 09/03/2015   Restless leg syndrome, uncontrolled 09/03/2015   Essential hypertension, benign 09/17/2015   Depression, major, single episode, moderate (HCC) 02/14/2017   Dyslipidemia 03/04/2017   Bursitis of intermetatarsal bursa of left foot 02/28/2018   Chronic pain syndrome 03/02/2018   Metatarsalgia of left foot 11/02/2018   Scrotal pain 01/10/2019   Primary osteoarthritis of both knees 03/28/2019   Benzodiazepine misuse 07/10/2019   Sebaceous cyst of right thumb 05/10/2020   Current severe episode of major depressive disorder without psychotic features without prior episode (HCC) 05/13/2020   Anxiety 05/13/2020   At high risk for falls 05/13/2020   Alcohol use disorder in remission 10/23/2022   Resolved Ambulatory Problems    Diagnosis Date Noted   Radiculopathy of lumbar region 08/19/2010   Spinal stenosis at L4-L5 level 05/24/2014   Degenerative disc disease, lumbar 01/10/2015   Cramp of both lower extremities 09/03/2015   Past Medical History:  Diagnosis Date   Headache     Hyperlipemia    Hypertension      Review of Systems  All other systems reviewed and are negative.     Objective:     BP 125/73   Pulse 72   Ht 6\' 1"  (1.854 m)   Wt 184 lb 12 oz (83.8 kg)   SpO2 98%   BMI 24.37 kg/m  BP Readings from Last 3 Encounters:  10/23/22 125/73  08/04/22 (!) 160/87  04/22/22 (!) 145/86   Wt Readings from Last 3 Encounters:  10/23/22 184 lb 12 oz (83.8 kg)  04/22/22 189 lb (85.7 kg)  09/08/21 200 lb (90.7 kg)     Physical Exam Constitutional:      Appearance: Normal appearance. He is obese.  HENT:     Head: Normocephalic.  Cardiovascular:     Rate and Rhythm: Normal rate and regular rhythm.     Pulses: Normal pulses.     Heart sounds: Normal heart sounds.  Pulmonary:     Effort: Pulmonary effort is normal.     Breath sounds: Normal breath sounds.  Musculoskeletal:     Cervical back: Normal range of motion and neck supple.     Right lower leg: No edema.     Left lower leg: No edema.  Neurological:     General: No focal deficit present.     Mental Status: He is alert and oriented to person, place, and time.  Psychiatric:        Mood and Affect: Mood normal.  The 10-year ASCVD risk score (Arnett DK, et al., 2019) is: 4.3%    Assessment & Plan:  .Marland KitchenCorrie was seen today for medical management of chronic issues.  Diagnoses and all orders for this visit:  Essential hypertension, benign -     lisinopril (ZESTRIL) 10 MG tablet; Take 1 tablet (10 mg total) by mouth daily. -     CMP14+EGFR  Encounter for immunization -     Flu vaccine trivalent PF, 6mos and older(Flulaval,Afluria,Fluarix,Fluzone)  Tobacco dependence  Dyslipidemia -     Lipid panel -     CMP14+EGFR  Chronic pain syndrome  Alcohol use disorder in remission   BP looks great Will get labs to follow up on cholesterol  Refilled medications today Flu shot given today   Return in about 6 months (around 04/22/2023).    Tandy Gaw, PA-C

## 2022-12-01 DIAGNOSIS — G894 Chronic pain syndrome: Secondary | ICD-10-CM | POA: Diagnosis not present

## 2022-12-01 DIAGNOSIS — M4726 Other spondylosis with radiculopathy, lumbar region: Secondary | ICD-10-CM | POA: Diagnosis not present

## 2022-12-01 DIAGNOSIS — M48061 Spinal stenosis, lumbar region without neurogenic claudication: Secondary | ICD-10-CM | POA: Diagnosis not present

## 2022-12-01 DIAGNOSIS — M546 Pain in thoracic spine: Secondary | ICD-10-CM | POA: Diagnosis not present

## 2022-12-01 DIAGNOSIS — M5459 Other low back pain: Secondary | ICD-10-CM | POA: Diagnosis not present

## 2022-12-01 DIAGNOSIS — M961 Postlaminectomy syndrome, not elsewhere classified: Secondary | ICD-10-CM | POA: Diagnosis not present

## 2022-12-09 DIAGNOSIS — Z1211 Encounter for screening for malignant neoplasm of colon: Secondary | ICD-10-CM | POA: Diagnosis not present

## 2022-12-17 LAB — COLOGUARD: COLOGUARD: NEGATIVE

## 2022-12-18 NOTE — Progress Notes (Signed)
Normal cologuard. GREAT news. Re-test in 3 years.

## 2023-01-07 DIAGNOSIS — M47816 Spondylosis without myelopathy or radiculopathy, lumbar region: Secondary | ICD-10-CM | POA: Diagnosis not present

## 2023-01-07 DIAGNOSIS — M48061 Spinal stenosis, lumbar region without neurogenic claudication: Secondary | ICD-10-CM | POA: Diagnosis not present

## 2023-02-16 ENCOUNTER — Encounter: Payer: Self-pay | Admitting: Sports Medicine

## 2023-02-16 DIAGNOSIS — M961 Postlaminectomy syndrome, not elsewhere classified: Secondary | ICD-10-CM

## 2023-02-25 DIAGNOSIS — M5459 Other low back pain: Secondary | ICD-10-CM | POA: Diagnosis not present

## 2023-02-25 DIAGNOSIS — M51362 Other intervertebral disc degeneration, lumbar region with discogenic back pain and lower extremity pain: Secondary | ICD-10-CM | POA: Diagnosis not present

## 2023-02-25 DIAGNOSIS — M47816 Spondylosis without myelopathy or radiculopathy, lumbar region: Secondary | ICD-10-CM | POA: Diagnosis not present

## 2023-02-25 DIAGNOSIS — M961 Postlaminectomy syndrome, not elsewhere classified: Secondary | ICD-10-CM | POA: Diagnosis not present

## 2023-02-25 DIAGNOSIS — G894 Chronic pain syndrome: Secondary | ICD-10-CM | POA: Diagnosis not present

## 2023-02-25 DIAGNOSIS — M48061 Spinal stenosis, lumbar region without neurogenic claudication: Secondary | ICD-10-CM | POA: Diagnosis not present

## 2023-04-05 DIAGNOSIS — M461 Sacroiliitis, not elsewhere classified: Secondary | ICD-10-CM | POA: Diagnosis not present

## 2023-04-23 ENCOUNTER — Ambulatory Visit: Payer: Medicare Other | Admitting: Physician Assistant

## 2023-05-17 DIAGNOSIS — G894 Chronic pain syndrome: Secondary | ICD-10-CM | POA: Diagnosis not present

## 2023-05-17 DIAGNOSIS — M48061 Spinal stenosis, lumbar region without neurogenic claudication: Secondary | ICD-10-CM | POA: Diagnosis not present

## 2023-05-17 DIAGNOSIS — M461 Sacroiliitis, not elsewhere classified: Secondary | ICD-10-CM | POA: Diagnosis not present

## 2023-05-17 DIAGNOSIS — M47816 Spondylosis without myelopathy or radiculopathy, lumbar region: Secondary | ICD-10-CM | POA: Diagnosis not present

## 2023-06-03 DIAGNOSIS — M5459 Other low back pain: Secondary | ICD-10-CM | POA: Diagnosis not present

## 2023-06-03 DIAGNOSIS — M47816 Spondylosis without myelopathy or radiculopathy, lumbar region: Secondary | ICD-10-CM | POA: Diagnosis not present

## 2023-06-06 ENCOUNTER — Ambulatory Visit
Admission: EM | Admit: 2023-06-06 | Discharge: 2023-06-06 | Disposition: A | Discharge status: Home / Self Care | Attending: Family Medicine | Admitting: Family Medicine

## 2023-06-06 ENCOUNTER — Other Ambulatory Visit: Payer: Self-pay

## 2023-06-06 ENCOUNTER — Encounter: Payer: Self-pay | Admitting: Emergency Medicine

## 2023-06-06 ENCOUNTER — Ambulatory Visit

## 2023-06-06 ENCOUNTER — Encounter: Payer: Self-pay | Admitting: Physician Assistant

## 2023-06-06 DIAGNOSIS — R112 Nausea with vomiting, unspecified: Secondary | ICD-10-CM | POA: Diagnosis not present

## 2023-06-06 DIAGNOSIS — R109 Unspecified abdominal pain: Secondary | ICD-10-CM | POA: Diagnosis not present

## 2023-06-06 MED ORDER — ONDANSETRON 8 MG PO TBDP
8.0000 mg | ORAL_TABLET | Freq: Once | ORAL | Status: AC
  Administered 2023-06-06: 8 mg via ORAL

## 2023-06-06 MED ORDER — ONDANSETRON 8 MG PO TBDP: 8.0000 mg | ORAL_TABLET | Freq: Three times a day (TID) | ORAL | 0 refills | Status: AC | PRN

## 2023-06-06 NOTE — ED Triage Notes (Addendum)
 Patient presents to Urgent Care with complaints of vomiting, belching, dizziness and nausea since 2 days ago. Patient reports vomiting up. Having cold sweats, chills. Ate at [facility] and symptoms started after that. Unable to keep any liquids down. Taking Dramamine.

## 2023-06-06 NOTE — Discharge Instructions (Addendum)
 Advised patient may take Zofran  daily or as needed for nausea.  Encouraged to increase daily water intake to 64 ounces per day.  Advised patient may include Gatorade G2 and/or Pedialyte for improved cardiac electrolyte replenishment.  Advised if symptoms worsen and/or unresolved please follow-up with your PCP or here for further evaluation.
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
## 2023-06-07 NOTE — Telephone Encounter (Signed)
 Attempted call to patient to have pt schedule appt but had to leave a voice mail message requesting a return call.

## 2023-06-09 ENCOUNTER — Encounter: Payer: Self-pay | Admitting: Physician Assistant

## 2023-06-09 ENCOUNTER — Ambulatory Visit (INDEPENDENT_AMBULATORY_CARE_PROVIDER_SITE_OTHER): Admitting: Physician Assistant

## 2023-06-09 VITALS — BP 116/84 | HR 82 | Temp 97.8°F | Ht 73.0 in | Wt 172.0 lb

## 2023-06-09 DIAGNOSIS — R1013 Epigastric pain: Secondary | ICD-10-CM | POA: Diagnosis not present

## 2023-06-09 DIAGNOSIS — R1114 Bilious vomiting: Secondary | ICD-10-CM

## 2023-06-09 DIAGNOSIS — A059 Bacterial foodborne intoxication, unspecified: Secondary | ICD-10-CM | POA: Diagnosis not present

## 2023-06-09 DIAGNOSIS — R142 Eructation: Secondary | ICD-10-CM | POA: Diagnosis not present

## 2023-06-09 DIAGNOSIS — F1091 Alcohol use, unspecified, in remission: Secondary | ICD-10-CM | POA: Diagnosis not present

## 2023-06-09 LAB — COMPREHENSIVE METABOLIC PANEL WITH GFR
ALT: 22 IU/L (ref 0–44)
AST: 24 IU/L (ref 0–40)
Albumin: 4.6 g/dL (ref 4.1–5.1)
Alkaline Phosphatase: 88 IU/L (ref 44–121)
BUN/Creatinine Ratio: 12 (ref 9–20)
BUN: 14 mg/dL (ref 6–24)
Bilirubin Total: 0.9 mg/dL (ref 0.0–1.2)
CO2: 22 mmol/L (ref 20–29)
Calcium: 9.5 mg/dL (ref 8.7–10.2)
Chloride: 99 mmol/L (ref 96–106)
Creatinine, Ser: 1.13 mg/dL (ref 0.76–1.27)
Globulin, Total: 1.9 g/dL (ref 1.5–4.5)
Glucose: 93 mg/dL (ref 70–99)
Potassium: 4.5 mmol/L (ref 3.5–5.2)
Sodium: 137 mmol/L (ref 134–144)
Total Protein: 6.5 g/dL (ref 6.0–8.5)
eGFR: 81 mL/min/{1.73_m2} (ref >59)

## 2023-06-09 LAB — LIPASE: Lipase: 15 U/L (ref 13–78)

## 2023-06-09 LAB — CBC WITH DIFFERENTIAL/PLATELET
Basophils Absolute: 0.1 10*3/uL (ref 0.0–0.2)
Basos: 1 %
EOS (ABSOLUTE): 0.3 10*3/uL (ref 0.0–0.4)
Eos: 3 %
Hematocrit: 47.8 % (ref 37.5–51.0)
Hemoglobin: 16.7 g/dL (ref 13.0–17.7)
Immature Granulocytes: 0 %
Lymphocytes Absolute: 4.2 10*3/uL — ABNORMAL HIGH (ref 0.7–3.1)
Lymphs: 42 %
MCH: 34.2 pg — ABNORMAL HIGH (ref 26.6–33.0)
MCHC: 34.9 g/dL (ref 31.5–35.7)
MCV: 98 fL — ABNORMAL HIGH (ref 79–97)
Monocytes Absolute: 0.8 10*3/uL (ref 0.1–0.9)
Monocytes: 8 %
Neutrophils Absolute: 4.7 10*3/uL (ref 1.4–7.0)
Neutrophils: 46 %
Platelets: 342 10*3/uL (ref 150–450)
RBC: 4.89 x10E6/uL (ref 4.14–5.80)
RDW: 11.7 % (ref 11.6–15.4)
WBC: 10 10*3/uL (ref 3.4–10.8)

## 2023-06-09 LAB — TROPONIN T: Troponin T (Highly Sensitive): 7 ng/L (ref 0–22)

## 2023-06-09 MED ORDER — CIPROFLOXACIN HCL 500 MG PO TABS: 500.0000 mg | ORAL_TABLET | Freq: Two times a day (BID) | ORAL | 0 refills | Status: AC

## 2023-06-09 MED ORDER — PANTOPRAZOLE SODIUM 40 MG PO TBEC: 40.0000 mg | DELAYED_RELEASE_TABLET | Freq: Two times a day (BID) | ORAL | 1 refills | Status: DC

## 2023-06-09 NOTE — Progress Notes (Signed)
 Acute Office Visit  Subjective:     Patient ID: Andrew Bean, male    DOB: 1975-10-13, 48 y.o.   MRN: [id number]  Chief Complaint  Patient presents with   Emesis    HPI Patient is in today for nausea, vomiting, epigastric abdominal pain for 6 days, Friday morning. He is accompanied by his wife.   Symptoms started after he ate a egg burrito from [employer] on Friday morning. He vomited all day and had black bowel movements. He has not vomited as much but intermittently since Friday. His stools have resolved and brown again. He denies any fever but has had chills so bad that he has needed hot showers to get warm. His abdomen is tender and feels bloated and tight. He is belching a lot. Gas X did seem to help and the zofran  that was sent over. He tried his wifes omeprazole but did not feel like it helped and "left a pill taste in his mouth". He does smoke cigarettes. He was a daily alcohol drinker but realized it was a problem and stopped 2 weeks ago.   No one else ate burrito and no one else in family has had symptoms.   His wife is concerned about his heart. No chest pains or SOB.   Andrew Bean. Active Ambulatory Problems    Diagnosis Date Noted   Elevated blood pressure 09/03/2015   Tobacco dependence 09/03/2015   Lumbar post-laminectomy syndrome 09/03/2015   Insomnia 09/03/2015   Restless leg syndrome, uncontrolled 09/03/2015   Essential hypertension, benign 09/17/2015   Depression, major, single episode, moderate (HCC) 02/14/2017   Dyslipidemia 03/04/2017   Bursitis of intermetatarsal bursa of left foot 02/28/2018   Chronic pain syndrome 03/02/2018   Metatarsalgia of left foot 11/02/2018   Scrotal pain 01/10/2019   Primary osteoarthritis of both knees 03/28/2019   Benzodiazepine misuse 07/10/2019   Sebaceous cyst of right thumb 05/10/2020   Current severe episode of major depressive disorder without psychotic features without prior episode (HCC) 05/13/2020   Anxiety 05/13/2020   At  high risk for falls 05/13/2020   Alcohol use disorder in remission 10/23/2022   Bilious vomiting with nausea 06/09/2023   Epigastric pain 06/09/2023   Belching 06/09/2023   Food poisoning 06/11/2023   Resolved Ambulatory Problems    Diagnosis Date Noted   Radiculopathy of lumbar region 08/19/2010   Spinal stenosis at L4-L5 level 05/24/2014   Degenerative disc disease, lumbar 01/10/2015   Cramp of both lower extremities 09/03/2015   Past Medical History:  Diagnosis Date   Headache    Hyperlipemia    Hypertension       ROS See HPI.     Objective:    BP 116/84   Pulse 82   Temp 97.8 F (36.6 C) (Oral)   Wt 172 lb (78 kg)   SpO2 99%   BMI 22.69 kg/m  BP Readings from Last 3 Encounters:  06/09/23 116/84  06/06/23 (!) 141/101  10/23/22 125/73   Wt Readings from Last 3 Encounters:  06/09/23 172 lb (78 kg)  10/23/22 184 lb 12 oz (83.8 kg)  04/22/22 189 lb (85.7 kg)    EKG: NSR, no ST elevation or depression.   Physical Exam Constitutional:      Comments: Appears to be in discomfort  HENT:     Head: Normocephalic.  Cardiovascular:     Rate and Rhythm: Normal rate and regular rhythm.  Pulmonary:     Effort: Pulmonary effort is normal.  Breath sounds: Normal breath sounds.  Abdominal:     General: There is distension.     Palpations: Abdomen is soft. There is no mass.     Tenderness: There is abdominal tenderness. There is no right CVA tenderness, left CVA tenderness, guarding or rebound.     Comments: Generalized abdomina discomfort in all quadrants.   Musculoskeletal:     Cervical back: Normal range of motion and neck supple.     Right lower leg: No edema.     Left lower leg: No edema.  Neurological:     General: No focal deficit present.     Mental Status: He is alert and oriented to person, place, and time.  Psychiatric:        Mood and Affect: Mood normal.           Assessment & Plan:  .Andrew AasPayson was seen today for emesis.  Diagnoses and all  orders for this visit:  Bilious vomiting with nausea -     Cancel: H. pylori breath test -     Cancel: CMP14+EGFR -     Cancel: CBC w/Diff/Platelet -     Cancel: Lipase -     ciprofloxacin  (CIPRO ) 500 MG tablet; Take 1 tablet (500 mg total) by mouth 2 (two) times daily. For 7 days. -     pantoprazole  (PROTONIX ) 40 MG tablet; Take 1 tablet (40 mg total) by mouth 2 (two) times daily. -     Cancel: Troponin T -     H. pylori breath test -     CMP14+EGFR -     CBC w/Diff/Platelet -     Lipase -     Troponin T -     Comprehensive metabolic panel with GFR  Epigastric pain -     Cancel: H. pylori breath test -     Cancel: CMP14+EGFR -     Cancel: CBC w/Diff/Platelet -     Cancel: Lipase -     ciprofloxacin  (CIPRO ) 500 MG tablet; Take 1 tablet (500 mg total) by mouth 2 (two) times daily. For 7 days. -     pantoprazole  (PROTONIX ) 40 MG tablet; Take 1 tablet (40 mg total) by mouth 2 (two) times daily. -     Cancel: Troponin T -     H. pylori breath test -     CMP14+EGFR -     CBC w/Diff/Platelet -     Lipase -     Troponin T -     Comprehensive metabolic panel with GFR  Belching -     Cancel: H. pylori breath test -     Cancel: CMP14+EGFR -     Cancel: CBC w/Diff/Platelet -     Cancel: Lipase -     ciprofloxacin  (CIPRO ) 500 MG tablet; Take 1 tablet (500 mg total) by mouth 2 (two) times daily. For 7 days. -     pantoprazole  (PROTONIX ) 40 MG tablet; Take 1 tablet (40 mg total) by mouth 2 (two) times daily. -     Cancel: Troponin T -     H. pylori breath test -     CMP14+EGFR -     CBC w/Diff/Platelet -     Lipase -     Troponin T -     Comprehensive metabolic panel with GFR  Alcohol use disorder in remission -     Cancel: Lipase -     Lipase  Food poisoning -     ciprofloxacin  (CIPRO ) 500 MG  tablet; Take 1 tablet (500 mg total) by mouth 2 (two) times daily. For 7 days.    Suspect gastritis from food poisoning  No red flag physical exam findings on todays exam See EKG  results above, do not think any symptoms are cardiac related. Troponin stat ordered for confirmation.  Will get cmp, cbc, lipase to rule out pancreatitis, cholecystitis, concerning WBC elevation or acute blood loss. H.pylori breat test done in office today GI cocktail given today Continue zofran  for nausea Start protonix  bid for 1 week then daily for 4-6 weeks Start cipro  for 7 days for food poisoning, empirically.  If lipase normal advance diet to BLAND foods Continue to NOT drink any alcohol or take any NSAIDs.  Follow up if worsening or not improving and in 6 weeks to make sure gastritis has resolved.     Jeannelle Wiens, PA-C

## 2023-06-09 NOTE — Progress Notes (Signed)
 Lipase normal. No pancreatitis. BLAND diet ok.  Troponin normal.no elevation. Reassuring not your heart.  Kidney and liver normal.   Treatment plan stays the same for gastritis.

## 2023-06-09 NOTE — Patient Instructions (Addendum)
 Will get labs and breath test today.  Suspect gastritis from food poisoning GI cocktail given today Continue zofran  for nausea Start protonix  twice a day for 1 week then daily for 4-6 weeks.  Start cipro for 7 days  Gastritis, Adult Gastritis is inflammation of the stomach. There are two kinds of gastritis: Acute gastritis. This kind develops suddenly. Chronic gastritis. This kind is much more common. It develops slowly and lasts for a long time. Gastritis happens when the lining of the stomach becomes weak or gets damaged. Without treatment, gastritis can lead to stomach bleeding and ulcers. What are the causes? This condition may be caused by: An infection. Drinking too much alcohol. Certain medicines. These include steroids, antibiotics, and some over-the-counter medicines, such as aspirin  or ibuprofen . Having too much acid in the stomach. Having a disease of the stomach. Other causes may include: An allergic reaction. Some cancer treatments (radiation). Smoking cigarettes or the use of products that contain nicotine  or tobacco. In some cases, the cause of this condition is not known. What increases the risk? Having a disease of the intestines. Having a disease in which the body's immune system attacks the body (autoimmune disease), such as Crohn's disease. Using aspirin  or ibuprofen  and other NSAIDs to treat other conditions, such as heart disease or chronic pain. Stress. What are the signs or symptoms? Symptoms of this condition include: Pain or a burning sensation in the upper abdomen. Nausea. Vomiting. An uncomfortable feeling of fullness after eating. Weight loss. Bad breath. Blood in your vomit or stool (feces). In some cases, there are no symptoms. How is this diagnosed? This condition may be diagnosed based on your medical history, a physical exam, and tests. Tests may include: Your medical history and a description of your symptoms. A physical exam. Tests. These  can include: Blood tests. Stool tests. A test in which a thin, flexible instrument with a light and a camera is passed down the esophagus and into the stomach (upper endoscopy). A test in which a tissue sample is removed to look at it under a microscope (biopsy). How is this treated? This condition may be treated with medicines. The medicines that are used vary depending on the cause of the gastritis. If the condition is caused by a bacterial infection, you may be given antibiotic medicines. If the condition is caused by too much acid in the stomach, you may be given medicines called H2 blockers, proton pump inhibitors, or antacids. Treatment may also involve stopping the use of certain medicines such as aspirin  or ibuprofen  and other NSAIDs. Follow these instructions at home: Medicines Take over-the-counter and prescription medicines only as told by your health care provider. If you were prescribed an antibiotic medicine, take it as told by your health care provider. Do not stop taking the antibiotic even if you start to feel better. Alcohol use Do not drink alcohol if: Your health care provider tells you not to drink. You are pregnant, may be pregnant, or are planning to become pregnant. If you drink alcohol: Limit your use to: 0-1 drink a day for women. 0-2 drinks a day for men. Know how much alcohol is in your drink. In the [place] one drink equals one 12 oz bottle of beer (355 mL), one 5 oz glass of wine (148 mL), or one 1 oz glass of hard liquor (44 mL). General instructions  Eat small, frequent meals instead of large meals. Avoid foods and drinks that make your symptoms worse. Talk with your health  care provider about ways to manage stress, such as getting regular exercise or practicing deep breathing, meditation, or yoga. Do not use any products that contain nicotine  or tobacco. These products include cigarettes, chewing tobacco, and vaping devices, such as e-cigarettes. If you  need help quitting, ask your health care provider. Drink enough fluid to keep your urine pale yellow. Keep all follow-up visits. This is important. Contact a health care provider if: Your symptoms get worse. Your abdominal pain gets worse. Your symptoms return after treatment. You have a fever. Get help right away if: You vomit blood or a substance that looks like coffee grounds. You have black or dark red stools. You are unable to keep fluids down. These symptoms may represent a serious problem that is an emergency. Do not wait to see if the symptoms will go away. Get medical help right away. Call your local emergency services (911 in the [place] Do not drive yourself to the hospital. Summary Gastritis is inflammation of the lining of the stomach that can occur suddenly (acute) or develop slowly over time (chronic). This condition is diagnosed with a medical history, a physical exam, or tests. This condition may be treated with medicines to treat infection or medicines to reduce the amount of acid in your stomach. Follow your health care provider's instructions about taking medicines, making changes to your diet, and knowing when to call for help. This information is not intended to replace advice given to you by your health care provider. Make sure you discuss any questions you have with your health care provider. Document Revised: 05/25/2020 Document Reviewed: 05/25/2020 Elsevier Patient Education  2024 [employer].

## 2023-06-11 ENCOUNTER — Ambulatory Visit: Admitting: Physician Assistant

## 2023-06-11 ENCOUNTER — Encounter: Payer: Self-pay | Admitting: Physician Assistant

## 2023-06-11 DIAGNOSIS — A059 Bacterial foodborne intoxication, unspecified: Secondary | ICD-10-CM | POA: Insufficient documentation

## 2023-06-11 LAB — H. PYLORI BREATH TEST: H pylori Breath Test: NEGATIVE

## 2023-06-11 MED ORDER — DICYCLOMINE HCL 10 MG/5ML PO SOLN
10.0000 mg | Freq: Once | ORAL | Status: AC
  Administered 2023-06-11: 10 mg via ORAL

## 2023-06-11 MED ORDER — SUCRALFATE 1 G PO TABS: 1.0000 g | ORAL_TABLET | Freq: Three times a day (TID) | ORAL | 0 refills | Status: AC

## 2023-06-11 MED ORDER — HYOSCYAMINE SULFATE 0.125 MG SL SUBL
0.2500 mg | SUBLINGUAL_TABLET | Freq: Once | SUBLINGUAL | Status: AC
  Administered 2023-06-11: 0.25 mg via SUBLINGUAL

## 2023-06-11 MED ORDER — ALUM & MAG HYDROXIDE-SIMETH 200-200-20 MG/5ML PO SUSP
30.0000 mL | Freq: Once | ORAL | Status: AC
  Administered 2023-06-11: 30 mL via ORAL

## 2023-06-11 NOTE — Progress Notes (Signed)
Negative h.pylori

## 2023-06-11 NOTE — Addendum Note (Signed)
 Addended by: Hillarie Harrigan A on: 06/11/2023 07:55 AM   Modules accepted: Orders

## 2023-06-16 MED ORDER — PROMETHAZINE HCL 25 MG PO TABS: 25.0000 mg | ORAL_TABLET | Freq: Four times a day (QID) | ORAL | 0 refills | Status: AC | PRN

## 2023-06-16 NOTE — Telephone Encounter (Signed)
 Hello Andrew Bean,  The letter requesting to excuse you from work missed is in your Mychart but is not signed. A signed copy is at the front desk for pick up.  Thank you,  Lucetta Russel, LPN

## 2023-06-16 NOTE — Telephone Encounter (Signed)
 Ok for letter for wife who is a pt, miranda Souter.

## 2023-06-16 NOTE — Addendum Note (Signed)
 Addended by: Araceli Knight on: 06/16/2023 12:41 PM   Modules accepted: Orders

## 2023-06-18 ENCOUNTER — Ambulatory Visit: Payer: Self-pay | Admitting: Physician Assistant

## 2023-06-18 ENCOUNTER — Ambulatory Visit

## 2023-06-18 DIAGNOSIS — R1013 Epigastric pain: Secondary | ICD-10-CM | POA: Diagnosis not present

## 2023-06-18 DIAGNOSIS — R112 Nausea with vomiting, unspecified: Secondary | ICD-10-CM

## 2023-06-18 DIAGNOSIS — K824 Cholesterolosis of gallbladder: Secondary | ICD-10-CM | POA: Insufficient documentation

## 2023-06-18 DIAGNOSIS — N200 Calculus of kidney: Secondary | ICD-10-CM

## 2023-06-18 DIAGNOSIS — R1114 Bilious vomiting: Secondary | ICD-10-CM

## 2023-06-18 DIAGNOSIS — R142 Eructation: Secondary | ICD-10-CM

## 2023-06-18 NOTE — Progress Notes (Signed)
 No acute gallbladder inflammation but gallbladder polyps seen. We could certainly consider GI referral. At the lease follow up u/s in 6 months. How are you doing?

## 2023-06-18 NOTE — Telephone Encounter (Signed)
 Spoke with patient's wife.  He is doing better - able to eat now.  ( Wife mentioned that she also got violently ill for 24 hours but has now improved )  She will speak with patient about GI referral but not sure he is willing to do this.  She states she will make sure that the patient gets the repeat US  in 6 months.  She is wondering if the gall bladder polyps could have made a viral or food borne illness last longer ? She also wanted to know if they should be concerned over the 6mm kidney stone found in the ultrasound results.

## 2023-06-30 NOTE — Addendum Note (Signed)
 Addended by: Doretha Ganja on: 06/30/2023 02:14 PM   Modules accepted: Orders

## 2023-07-01 ENCOUNTER — Other Ambulatory Visit: Payer: Self-pay | Admitting: Physician Assistant

## 2023-07-01 DIAGNOSIS — R1114 Bilious vomiting: Secondary | ICD-10-CM

## 2023-07-01 DIAGNOSIS — R142 Eructation: Secondary | ICD-10-CM

## 2023-07-01 DIAGNOSIS — R1013 Epigastric pain: Secondary | ICD-10-CM

## 2023-07-02 DIAGNOSIS — N200 Calculus of kidney: Secondary | ICD-10-CM | POA: Insufficient documentation

## 2023-07-07 DIAGNOSIS — M47816 Spondylosis without myelopathy or radiculopathy, lumbar region: Secondary | ICD-10-CM | POA: Diagnosis not present

## 2023-07-07 DIAGNOSIS — M5459 Other low back pain: Secondary | ICD-10-CM | POA: Diagnosis not present

## 2023-07-13 ENCOUNTER — Telehealth (HOSPITAL_BASED_OUTPATIENT_CLINIC_OR_DEPARTMENT_OTHER): Payer: Self-pay

## 2023-07-13 ENCOUNTER — Encounter: Payer: Self-pay | Admitting: Urology

## 2023-07-13 ENCOUNTER — Ambulatory Visit: Admitting: Urology

## 2023-07-13 VITALS — BP 129/89 | HR 93 | Ht 72.0 in | Wt 185.0 lb

## 2023-07-13 DIAGNOSIS — R3129 Other microscopic hematuria: Secondary | ICD-10-CM | POA: Insufficient documentation

## 2023-07-13 DIAGNOSIS — N200 Calculus of kidney: Secondary | ICD-10-CM | POA: Diagnosis not present

## 2023-07-13 LAB — URINALYSIS, ROUTINE W REFLEX MICROSCOPIC
Bilirubin, UA: NEGATIVE
Glucose, UA: NEGATIVE
Ketones, UA: NEGATIVE
Leukocytes,UA: NEGATIVE
Nitrite, UA: NEGATIVE
Protein,UA: NEGATIVE
Specific Gravity, UA: 1.02 (ref 1.005–1.030)
Urobilinogen, Ur: 0.2 mg/dL (ref 0.2–1.0)
pH, UA: 6.5 (ref 5.0–7.5)

## 2023-07-13 LAB — MICROSCOPIC EXAMINATION: Bacteria, UA: NONE SEEN
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
## 2023-07-19 ENCOUNTER — Ambulatory Visit (HOSPITAL_BASED_OUTPATIENT_CLINIC_OR_DEPARTMENT_OTHER)
Admission: RE | Admit: 2023-07-19 | Discharge: 2023-07-19 | Disposition: A | Discharge status: Home / Self Care | Source: Ambulatory Visit | Attending: Urology | Admitting: Urology

## 2023-07-19 ENCOUNTER — Encounter (HOSPITAL_BASED_OUTPATIENT_CLINIC_OR_DEPARTMENT_OTHER): Payer: Self-pay

## 2023-07-19 ENCOUNTER — Other Ambulatory Visit: Payer: Self-pay | Admitting: Physician Assistant

## 2023-07-19 DIAGNOSIS — N200 Calculus of kidney: Secondary | ICD-10-CM | POA: Diagnosis not present

## 2023-07-19 DIAGNOSIS — R3129 Other microscopic hematuria: Secondary | ICD-10-CM | POA: Diagnosis not present

## 2023-07-19 DIAGNOSIS — N289 Disorder of kidney and ureter, unspecified: Secondary | ICD-10-CM | POA: Diagnosis not present

## 2023-07-19 DIAGNOSIS — I10 Essential (primary) hypertension: Secondary | ICD-10-CM

## 2023-07-19 MED ORDER — IOHEXOL 300 MG/ML  SOLN
100.0000 mL | Freq: Once | INTRAMUSCULAR | Status: AC | PRN
  Administered 2023-07-19: 125 mL via INTRAVENOUS

## 2023-07-26 ENCOUNTER — Encounter: Payer: Self-pay | Admitting: Urology

## 2023-07-26 ENCOUNTER — Telehealth: Payer: Self-pay | Admitting: Urology

## 2023-07-26 NOTE — Telephone Encounter (Signed)
 Called regarding CT results.  Let patient know that we did get mychart message and Dr will return his call or message as soon as he can.  Wanted to make you aware.

## 2023-07-28 ENCOUNTER — Telehealth: Payer: Self-pay | Admitting: Urology

## 2023-07-28 ENCOUNTER — Ambulatory Visit: Payer: Self-pay | Admitting: Urology

## 2023-07-28 NOTE — Telephone Encounter (Signed)
-----   Message from [facility] sent at 07/28/2023 11:16 AM EDT ----- Please schedule for f/u appt in 6 months.

## 2023-07-28 NOTE — Telephone Encounter (Signed)
 LVM for patient to call back and schedule

## 2023-08-02 ENCOUNTER — Telehealth: Payer: Self-pay | Admitting: Urology

## 2023-08-02 NOTE — Telephone Encounter (Signed)
 LVM FOR PATIENT TO CALL AND SCHEDULE

## 2023-08-02 NOTE — Telephone Encounter (Signed)
-----   Message from [facility] sent at 07/28/2023 11:16 AM EDT ----- Please schedule for f/u appt in 6 months.

## 2023-08-16 ENCOUNTER — Telehealth: Payer: Self-pay | Admitting: Urology

## 2023-08-16 DIAGNOSIS — M51362 Other intervertebral disc degeneration, lumbar region with discogenic back pain and lower extremity pain: Secondary | ICD-10-CM | POA: Diagnosis not present

## 2023-08-16 DIAGNOSIS — Z981 Arthrodesis status: Secondary | ICD-10-CM | POA: Diagnosis not present

## 2023-08-16 DIAGNOSIS — M48061 Spinal stenosis, lumbar region without neurogenic claudication: Secondary | ICD-10-CM | POA: Diagnosis not present

## 2023-08-16 DIAGNOSIS — M961 Postlaminectomy syndrome, not elsewhere classified: Secondary | ICD-10-CM | POA: Diagnosis not present

## 2023-08-16 DIAGNOSIS — M47816 Spondylosis without myelopathy or radiculopathy, lumbar region: Secondary | ICD-10-CM | POA: Diagnosis not present

## 2023-08-16 DIAGNOSIS — G894 Chronic pain syndrome: Secondary | ICD-10-CM | POA: Diagnosis not present

## 2023-08-16 NOTE — Telephone Encounter (Signed)
 Called to schedule patient for a 6 month follow up appointment. Patients wife answered and stated they already had an appointment made with us . I let her know that there is not an appointment made with us  and there is not one that shows that he cancelled. She insists that there is an appointment in his mychart for our clinic. I let her know again that there was not one. She said she would look into it and call us  back at another time.

## 2023-09-06 ENCOUNTER — Encounter: Payer: Self-pay | Admitting: Physician Assistant

## 2023-09-06 DIAGNOSIS — R1114 Bilious vomiting: Secondary | ICD-10-CM

## 2023-09-24 ENCOUNTER — Ambulatory Visit (INDEPENDENT_AMBULATORY_CARE_PROVIDER_SITE_OTHER): Admitting: Sports Medicine

## 2023-09-24 DIAGNOSIS — M961 Postlaminectomy syndrome, not elsewhere classified: Secondary | ICD-10-CM

## 2023-09-24 NOTE — Assessment & Plan Note (Signed)
 Andrew Bean has complex low back pain with postlaminectomy syndrome, he is status post L3-L5 fusion, he had a new disc extrusion L5-S1 level on MRI from 2021, multilevel facet arthropathy, he does well with multilevel facet radiofrequency ablations with Dr. Rosella, we filled out additional updated disability paperwork today which will be scanned in. Further management per Dr. Rosella.

## 2023-09-24 NOTE — Progress Notes (Signed)
    Procedures performed today:    None.  Independent interpretation of notes and tests performed by another provider:   None.  Brief History, Exam, Impression, and Recommendations:    Lumbar post-laminectomy syndrome Andrew Bean has complex low back pain with postlaminectomy syndrome, he is status post L3-L5 fusion, he had a new disc extrusion L5-S1 level on MRI from 2021, multilevel facet arthropathy, he does well with multilevel facet radiofrequency ablations with Dr. Rosella, we filled out additional updated disability paperwork today which will be scanned in. Further management per Dr. Rosella.    ____________________________________________ Debby PARAS. Curtis, M.D., ABFM., CAQSM., AME. Primary Care and Sports Medicine [facility]  Adjunct Professor of [facility] Medicine  [employer]  [employer]  Restaurant manager, fast food

## 2023-09-28 DIAGNOSIS — G894 Chronic pain syndrome: Secondary | ICD-10-CM | POA: Diagnosis not present

## 2023-09-28 DIAGNOSIS — M4726 Other spondylosis with radiculopathy, lumbar region: Secondary | ICD-10-CM | POA: Diagnosis not present

## 2023-09-28 DIAGNOSIS — M48061 Spinal stenosis, lumbar region without neurogenic claudication: Secondary | ICD-10-CM | POA: Diagnosis not present

## 2023-09-28 DIAGNOSIS — M47894 Other spondylosis, thoracic region: Secondary | ICD-10-CM | POA: Diagnosis not present

## 2023-09-28 DIAGNOSIS — M51362 Other intervertebral disc degeneration, lumbar region with discogenic back pain and lower extremity pain: Secondary | ICD-10-CM | POA: Diagnosis not present

## 2023-10-05 ENCOUNTER — Encounter: Payer: Self-pay | Admitting: Sports Medicine

## 2023-10-20 DIAGNOSIS — M48061 Spinal stenosis, lumbar region without neurogenic claudication: Secondary | ICD-10-CM | POA: Diagnosis not present

## 2023-10-20 DIAGNOSIS — M51369 Other intervertebral disc degeneration, lumbar region without mention of lumbar back pain or lower extremity pain: Secondary | ICD-10-CM | POA: Diagnosis not present

## 2023-11-05 DIAGNOSIS — K2100 Gastro-esophageal reflux disease with esophagitis, without bleeding: Secondary | ICD-10-CM | POA: Diagnosis not present

## 2023-11-05 DIAGNOSIS — K3189 Other diseases of stomach and duodenum: Secondary | ICD-10-CM | POA: Diagnosis not present

## 2023-11-05 DIAGNOSIS — K279 Peptic ulcer, site unspecified, unspecified as acute or chronic, without hemorrhage or perforation: Secondary | ICD-10-CM | POA: Diagnosis not present

## 2023-11-05 LAB — HM COLONOSCOPY

## 2024-01-12 ENCOUNTER — Ambulatory Visit: Admitting: Urology
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
